# Patient Record
Sex: Male | Born: 1940 | Race: White | Hispanic: No | Smoking: Never smoker
Health system: Southern US, Community
[De-identification: ages and names within clinical notes are randomized; demographics above are authoritative.]

## PROBLEM LIST (undated history)

## (undated) DIAGNOSIS — F0390 Unspecified dementia without behavioral disturbance: Secondary | ICD-10-CM

## (undated) DIAGNOSIS — M109 Gout, unspecified: Secondary | ICD-10-CM

## (undated) DIAGNOSIS — I10 Essential (primary) hypertension: Secondary | ICD-10-CM

## (undated) DIAGNOSIS — E785 Hyperlipidemia, unspecified: Secondary | ICD-10-CM

---

## 2019-12-12 ENCOUNTER — Emergency Department (HOSPITAL_COMMUNITY): Payer: Medicare Other

## 2019-12-12 ENCOUNTER — Other Ambulatory Visit: Payer: Self-pay

## 2019-12-12 ENCOUNTER — Emergency Department (HOSPITAL_COMMUNITY)
Admission: EM | Admit: 2019-12-12 | Discharge: 2019-12-12 | Disposition: A | Discharge status: Skilled Nursing Facility | Payer: Medicare Other | Source: Home / Self Care | Attending: Emergency Medicine | Admitting: Emergency Medicine

## 2019-12-12 ENCOUNTER — Encounter (HOSPITAL_COMMUNITY): Payer: Self-pay | Admitting: Emergency Medicine

## 2019-12-12 DIAGNOSIS — N12 Tubulo-interstitial nephritis, not specified as acute or chronic: Secondary | ICD-10-CM | POA: Diagnosis not present

## 2019-12-12 DIAGNOSIS — W19XXXA Unspecified fall, initial encounter: Secondary | ICD-10-CM | POA: Insufficient documentation

## 2019-12-12 DIAGNOSIS — Y9389 Activity, other specified: Secondary | ICD-10-CM | POA: Insufficient documentation

## 2019-12-12 DIAGNOSIS — R531 Weakness: Secondary | ICD-10-CM | POA: Insufficient documentation

## 2019-12-12 DIAGNOSIS — M25561 Pain in right knee: Secondary | ICD-10-CM | POA: Insufficient documentation

## 2019-12-12 DIAGNOSIS — Y9289 Other specified places as the place of occurrence of the external cause: Secondary | ICD-10-CM | POA: Insufficient documentation

## 2019-12-12 DIAGNOSIS — R42 Dizziness and giddiness: Secondary | ICD-10-CM | POA: Insufficient documentation

## 2019-12-12 DIAGNOSIS — A4159 Other Gram-negative sepsis: Secondary | ICD-10-CM | POA: Diagnosis not present

## 2019-12-12 DIAGNOSIS — F0390 Unspecified dementia without behavioral disturbance: Secondary | ICD-10-CM | POA: Insufficient documentation

## 2019-12-12 DIAGNOSIS — R296 Repeated falls: Secondary | ICD-10-CM | POA: Insufficient documentation

## 2019-12-12 DIAGNOSIS — Y999 Unspecified external cause status: Secondary | ICD-10-CM | POA: Insufficient documentation

## 2019-12-12 LAB — URINALYSIS, ROUTINE W REFLEX MICROSCOPIC
Bilirubin Urine: NEGATIVE
Glucose, UA: NEGATIVE mg/dL
Hgb urine dipstick: NEGATIVE
Ketones, ur: 5 mg/dL — AB
Leukocytes,Ua: NEGATIVE
Nitrite: NEGATIVE
Protein, ur: NEGATIVE mg/dL
Specific Gravity, Urine: 1.014 (ref 1.005–1.030)
pH: 5 (ref 5.0–8.0)

## 2019-12-12 LAB — COMPREHENSIVE METABOLIC PANEL
ALT: 16 U/L (ref 0–44)
AST: 21 U/L (ref 15–41)
Albumin: 4 g/dL (ref 3.5–5.0)
Alkaline Phosphatase: 79 U/L (ref 38–126)
Anion gap: 10 (ref 5–15)
BUN: 18 mg/dL (ref 8–23)
CO2: 27 mmol/L (ref 22–32)
Calcium: 9.2 mg/dL (ref 8.9–10.3)
Chloride: 97 mmol/L — ABNORMAL LOW (ref 98–111)
Creatinine, Ser: 1.19 mg/dL (ref 0.61–1.24)
GFR calc Af Amer: >60 mL/min (ref >60)
GFR calc non Af Amer: 58 mL/min — ABNORMAL LOW (ref >60)
Glucose, Bld: 65 mg/dL — ABNORMAL LOW (ref 70–99)
Potassium: 4.4 mmol/L (ref 3.5–5.1)
Sodium: 134 mmol/L — ABNORMAL LOW (ref 135–145)
Total Bilirubin: 0.9 mg/dL (ref 0.3–1.2)
Total Protein: 6.6 g/dL (ref 6.5–8.1)

## 2019-12-12 LAB — CBC WITH DIFFERENTIAL/PLATELET
Abs Immature Granulocytes: 0.04 10*3/uL (ref 0.00–0.07)
Basophils Absolute: 0 10*3/uL (ref 0.0–0.1)
Basophils Relative: 0 %
Eosinophils Absolute: 0.1 10*3/uL (ref 0.0–0.5)
Eosinophils Relative: 1 %
HCT: 39.3 % (ref 39.0–52.0)
Hemoglobin: 12.8 g/dL — ABNORMAL LOW (ref 13.0–17.0)
Immature Granulocytes: 0 %
Lymphocytes Relative: 13 %
Lymphs Abs: 1.2 10*3/uL (ref 0.7–4.0)
MCH: 28.3 pg (ref 26.0–34.0)
MCHC: 32.6 g/dL (ref 30.0–36.0)
MCV: 86.8 fL (ref 80.0–100.0)
Monocytes Absolute: 0.6 10*3/uL (ref 0.1–1.0)
Monocytes Relative: 7 %
Neutro Abs: 7.1 10*3/uL (ref 1.7–7.7)
Neutrophils Relative %: 79 %
Platelets: 228 10*3/uL (ref 150–400)
RBC: 4.53 MIL/uL (ref 4.22–5.81)
RDW: 14 % (ref 11.5–15.5)
WBC: 9.1 10*3/uL (ref 4.0–10.5)
nRBC: 0 % (ref 0.0–0.2)

## 2019-12-12 MED ORDER — SODIUM CHLORIDE 0.9 % IV SOLN: INTRAVENOUS | Status: DC

## 2019-12-12 MED ORDER — SODIUM CHLORIDE 0.9 % IV BOLUS
1000.0000 mL | Freq: Once | INTRAVENOUS | Status: AC
  Administered 2019-12-12: 1000 mL via INTRAVENOUS

## 2019-12-12 NOTE — ED Provider Notes (Signed)
[facility] DEPT Provider Note   CSN: [id number] Arrival date & time: 12/12/19  1427     History Chief Complaint  Patient presents with  . Fall    Braulio Kiedrowski is a 79 y.o. male.  79 year old male who presents with increasing dizziness as well as falls.  Patient normally uses a walker to ambulate.  According to his son, patient has had increasing trouble with his gait.  Denies any recent illness.  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  Denies any recent fever, vomiting, severe diarrhea.  Did take a Xanax today with his symptoms seem to have gotten worse.  Did have a fall about a week ago according to his son who was there and did not strike his head.  He did not strike his head today and did not lose consciousness.  He denies any neck discomfort.  No urinary symptoms.  Son notes that he has been evaluated for depression and has had decreased oral intake.        No past medical history on file.  There are no problems to display for this patient.   History reviewed. No pertinent surgical history.     No family history on file.  Social History   Tobacco Use  . Smoking status: Not on file  Substance Use Topics  . Alcohol use: Not on file  . Drug use: Not on file    Home Medications Prior to Admission medications   Not on File    Allergies    Patient has no known allergies.  Review of Systems   Review of Systems  All other systems reviewed and are negative.   Physical Exam Updated Vital Signs BP 116/63   Pulse 76   Temp 98 F (36.7 C) (Oral)   Resp (!) 21   SpO2 100%   Physical Exam Vitals and nursing note reviewed.  Constitutional:      General: He is not in acute distress.    Appearance: Normal appearance. He is well-developed. He is not toxic-appearing.  HENT:     Head: Normocephalic and atraumatic.  Eyes:   General: Lids are normal.     Conjunctiva/sclera: Conjunctivae normal.     Pupils: Pupils are equal, round, and reactive to light.  Neck:     Thyroid: No thyroid mass.     Trachea: No tracheal deviation.  Cardiovascular:     Rate and Rhythm: Normal rate and regular rhythm.     Heart sounds: Normal heart sounds. No murmur. No gallop.   Pulmonary:     Effort: Pulmonary effort is normal. No respiratory distress.     Breath sounds: Normal breath sounds. No stridor. No decreased breath sounds, wheezing, rhonchi or rales.  Abdominal:     General: Bowel sounds are normal. There is no distension.     Palpations: Abdomen is soft.     Tenderness: There is no abdominal tenderness. There is no rebound.  Musculoskeletal:        General: No tenderness. Normal range of motion.     Cervical back: Normal range of motion and neck supple.       Legs:  Skin:    General: Skin is warm and dry.     Findings: No abrasion or rash.  Neurological:     General: No focal deficit present.     Mental Status: He is alert and  oriented to person, place, and time.     GCS: GCS eye subscore is 4. GCS verbal subscore is 5. GCS motor subscore is 6.     Cranial Nerves: No cranial nerve deficit.     Sensory: No sensory deficit.  Psychiatric:        Attention and Perception: He is attentive.        Mood and Affect: Affect is flat.        Speech: Speech normal.        Behavior: Behavior normal.     ED Results / Procedures / Treatments   Labs (all labs ordered are listed, but only abnormal results are displayed) Labs Reviewed  URINE CULTURE  CBC WITH DIFFERENTIAL/PLATELET  COMPREHENSIVE METABOLIC PANEL  URINALYSIS, ROUTINE W REFLEX MICROSCOPIC    EKG None  Radiology No results found.  Procedures Procedures (including critical care time)  Medications Ordered in ED Medications  sodium chloride 0.9 % bolus 1,000 mL (has no administration in time range)  0.9 %  sodium chloride infusion (has no  administration in time range)    ED Course  I have reviewed the triage vital signs and the nursing notes.  Pertinent labs & imaging results that were available during my care of the patient were reviewed by me and considered in my medical decision making (see chart for details).    MDM Rules/Calculators/A&P                      Patient's imaging of head and C-spine without acute findings.  Urinalysis negative for infection.  CBC and semen are reassuring.  Patient has neurological baseline will be discharged home. Final Clinical Impression(s) / ED Diagnoses Final diagnoses:  None    Rx / DC Orders ED Discharge Orders    None       Lorre Nick, MD 12/12/19 [phone]

## 2019-12-12 NOTE — ED Triage Notes (Signed)
Arrives via EMS from [facility], C/C fall about an hour ago. He has had 3 falls since midnight last night. Ambulates with walker, patient states he did hit his head during a fall this AM. Arrives in a c-collar. States he gets dizzy walking around or putting on clothes. Dizziness/wekaness on L side since Saturday.

## 2019-12-12 NOTE — Discharge Instructions (Addendum)
Followup with your doctor as needed

## 2019-12-14 ENCOUNTER — Emergency Department (HOSPITAL_COMMUNITY): Payer: Medicare Other

## 2019-12-14 ENCOUNTER — Inpatient Hospital Stay (HOSPITAL_COMMUNITY)
Admission: EM | Admit: 2019-12-14 | Discharge: 2019-12-22 | DRG: 871 | Disposition: A | Discharge status: Hospice | Payer: Medicare Other | Source: Skilled Nursing Facility | Attending: Family Medicine | Admitting: Family Medicine

## 2019-12-14 ENCOUNTER — Other Ambulatory Visit: Payer: Self-pay

## 2019-12-14 ENCOUNTER — Encounter (HOSPITAL_COMMUNITY): Payer: Self-pay | Admitting: Emergency Medicine

## 2019-12-14 DIAGNOSIS — N179 Acute kidney failure, unspecified: Secondary | ICD-10-CM

## 2019-12-14 DIAGNOSIS — Z7409 Other reduced mobility: Secondary | ICD-10-CM

## 2019-12-14 DIAGNOSIS — F329 Major depressive disorder, single episode, unspecified: Secondary | ICD-10-CM | POA: Diagnosis not present

## 2019-12-14 DIAGNOSIS — Z515 Encounter for palliative care: Secondary | ICD-10-CM | POA: Diagnosis not present

## 2019-12-14 DIAGNOSIS — Z789 Other specified health status: Secondary | ICD-10-CM | POA: Diagnosis not present

## 2019-12-14 DIAGNOSIS — F419 Anxiety disorder, unspecified: Secondary | ICD-10-CM | POA: Diagnosis present

## 2019-12-14 DIAGNOSIS — Z66 Do not resuscitate: Secondary | ICD-10-CM | POA: Diagnosis present

## 2019-12-14 DIAGNOSIS — E785 Hyperlipidemia, unspecified: Secondary | ICD-10-CM | POA: Diagnosis present

## 2019-12-14 DIAGNOSIS — D696 Thrombocytopenia, unspecified: Secondary | ICD-10-CM | POA: Diagnosis present

## 2019-12-14 DIAGNOSIS — A4159 Other Gram-negative sepsis: Secondary | ICD-10-CM | POA: Diagnosis present

## 2019-12-14 DIAGNOSIS — F0390 Unspecified dementia without behavioral disturbance: Secondary | ICD-10-CM

## 2019-12-14 DIAGNOSIS — Z7984 Long term (current) use of oral hypoglycemic drugs: Secondary | ICD-10-CM

## 2019-12-14 DIAGNOSIS — E876 Hypokalemia: Secondary | ICD-10-CM | POA: Diagnosis not present

## 2019-12-14 DIAGNOSIS — D72829 Elevated white blood cell count, unspecified: Secondary | ICD-10-CM | POA: Diagnosis not present

## 2019-12-14 DIAGNOSIS — Z20822 Contact with and (suspected) exposure to covid-19: Secondary | ICD-10-CM | POA: Diagnosis present

## 2019-12-14 DIAGNOSIS — N12 Tubulo-interstitial nephritis, not specified as acute or chronic: Secondary | ICD-10-CM

## 2019-12-14 DIAGNOSIS — R4189 Other symptoms and signs involving cognitive functions and awareness: Secondary | ICD-10-CM | POA: Diagnosis present

## 2019-12-14 DIAGNOSIS — Z7189 Other specified counseling: Secondary | ICD-10-CM | POA: Diagnosis not present

## 2019-12-14 DIAGNOSIS — G9341 Metabolic encephalopathy: Secondary | ICD-10-CM | POA: Diagnosis present

## 2019-12-14 DIAGNOSIS — I110 Hypertensive heart disease with heart failure: Secondary | ICD-10-CM | POA: Diagnosis present

## 2019-12-14 DIAGNOSIS — R0902 Hypoxemia: Secondary | ICD-10-CM

## 2019-12-14 DIAGNOSIS — R6521 Severe sepsis with septic shock: Secondary | ICD-10-CM | POA: Diagnosis present

## 2019-12-14 DIAGNOSIS — J9601 Acute respiratory failure with hypoxia: Secondary | ICD-10-CM | POA: Diagnosis not present

## 2019-12-14 DIAGNOSIS — Z7982 Long term (current) use of aspirin: Secondary | ICD-10-CM

## 2019-12-14 DIAGNOSIS — B964 Proteus (mirabilis) (morganii) as the cause of diseases classified elsewhere: Secondary | ICD-10-CM | POA: Diagnosis present

## 2019-12-14 DIAGNOSIS — R6520 Severe sepsis without septic shock: Secondary | ICD-10-CM | POA: Diagnosis present

## 2019-12-14 DIAGNOSIS — J810 Acute pulmonary edema: Secondary | ICD-10-CM | POA: Diagnosis present

## 2019-12-14 DIAGNOSIS — R197 Diarrhea, unspecified: Secondary | ICD-10-CM

## 2019-12-14 DIAGNOSIS — R531 Weakness: Secondary | ICD-10-CM | POA: Diagnosis not present

## 2019-12-14 DIAGNOSIS — N390 Urinary tract infection, site not specified: Secondary | ICD-10-CM | POA: Diagnosis present

## 2019-12-14 DIAGNOSIS — R296 Repeated falls: Secondary | ICD-10-CM | POA: Diagnosis present

## 2019-12-14 DIAGNOSIS — I5021 Acute systolic (congestive) heart failure: Secondary | ICD-10-CM | POA: Diagnosis present

## 2019-12-14 DIAGNOSIS — E1151 Type 2 diabetes mellitus with diabetic peripheral angiopathy without gangrene: Secondary | ICD-10-CM | POA: Diagnosis present

## 2019-12-14 DIAGNOSIS — E860 Dehydration: Secondary | ICD-10-CM | POA: Diagnosis present

## 2019-12-14 DIAGNOSIS — I10 Essential (primary) hypertension: Secondary | ICD-10-CM | POA: Diagnosis not present

## 2019-12-14 DIAGNOSIS — E869 Volume depletion, unspecified: Secondary | ICD-10-CM | POA: Diagnosis present

## 2019-12-14 DIAGNOSIS — E872 Acidosis: Secondary | ICD-10-CM | POA: Diagnosis present

## 2019-12-14 DIAGNOSIS — A419 Sepsis, unspecified organism: Secondary | ICD-10-CM

## 2019-12-14 DIAGNOSIS — I509 Heart failure, unspecified: Secondary | ICD-10-CM

## 2019-12-14 DIAGNOSIS — Z79899 Other long term (current) drug therapy: Secondary | ICD-10-CM

## 2019-12-14 DIAGNOSIS — R918 Other nonspecific abnormal finding of lung field: Secondary | ICD-10-CM | POA: Diagnosis not present

## 2019-12-14 DIAGNOSIS — R269 Unspecified abnormalities of gait and mobility: Secondary | ICD-10-CM | POA: Diagnosis not present

## 2019-12-14 DIAGNOSIS — R4781 Slurred speech: Secondary | ICD-10-CM | POA: Diagnosis present

## 2019-12-14 DIAGNOSIS — I959 Hypotension, unspecified: Secondary | ICD-10-CM | POA: Diagnosis present

## 2019-12-14 HISTORY — DX: Gout, unspecified: M10.9

## 2019-12-14 HISTORY — DX: Hyperlipidemia, unspecified: E78.5

## 2019-12-14 HISTORY — DX: Essential (primary) hypertension: I10

## 2019-12-14 HISTORY — DX: Unspecified dementia, unspecified severity, without behavioral disturbance, psychotic disturbance, mood disturbance, and anxiety: F03.90

## 2019-12-14 LAB — CBC WITH DIFFERENTIAL/PLATELET
Abs Immature Granulocytes: 0.03 10*3/uL (ref 0.00–0.07)
Basophils Absolute: 0 10*3/uL (ref 0.0–0.1)
Basophils Relative: 0 %
Eosinophils Absolute: 0 10*3/uL (ref 0.0–0.5)
Eosinophils Relative: 0 %
HCT: 42.8 % (ref 39.0–52.0)
Hemoglobin: 13.7 g/dL (ref 13.0–17.0)
Immature Granulocytes: 1 %
Lymphocytes Relative: 5 %
Lymphs Abs: 0.2 10*3/uL — ABNORMAL LOW (ref 0.7–4.0)
MCH: 28.1 pg (ref 26.0–34.0)
MCHC: 32 g/dL (ref 30.0–36.0)
MCV: 87.9 fL (ref 80.0–100.0)
Monocytes Absolute: 0 10*3/uL — ABNORMAL LOW (ref 0.1–1.0)
Monocytes Relative: 0 %
Neutro Abs: 2.9 10*3/uL (ref 1.7–7.7)
Neutrophils Relative %: 94 %
Platelets: 246 10*3/uL (ref 150–400)
RBC: 4.87 MIL/uL (ref 4.22–5.81)
RDW: 14.2 % (ref 11.5–15.5)
WBC: 3.2 10*3/uL — ABNORMAL LOW (ref 4.0–10.5)
nRBC: 0 % (ref 0.0–0.2)

## 2019-12-14 LAB — URINALYSIS, ROUTINE W REFLEX MICROSCOPIC
Bilirubin Urine: NEGATIVE
Glucose, UA: NEGATIVE mg/dL
Ketones, ur: NEGATIVE mg/dL
Nitrite: POSITIVE — AB
Protein, ur: 100 mg/dL — AB
RBC / HPF: >50 RBC/hpf — ABNORMAL HIGH (ref 0–5)
Specific Gravity, Urine: 1.013 (ref 1.005–1.030)
WBC, UA: >50 WBC/hpf — ABNORMAL HIGH (ref 0–5)
pH: 8 (ref 5.0–8.0)

## 2019-12-14 LAB — COMPREHENSIVE METABOLIC PANEL
ALT: 17 U/L (ref 0–44)
AST: 25 U/L (ref 15–41)
Albumin: 4 g/dL (ref 3.5–5.0)
Alkaline Phosphatase: 109 U/L (ref 38–126)
Anion gap: 15 (ref 5–15)
BUN: 16 mg/dL (ref 8–23)
CO2: 21 mmol/L — ABNORMAL LOW (ref 22–32)
Calcium: 9 mg/dL (ref 8.9–10.3)
Chloride: 100 mmol/L (ref 98–111)
Creatinine, Ser: 1.43 mg/dL — ABNORMAL HIGH (ref 0.61–1.24)
GFR calc Af Amer: 54 mL/min — ABNORMAL LOW (ref >60)
GFR calc non Af Amer: 47 mL/min — ABNORMAL LOW (ref >60)
Glucose, Bld: 158 mg/dL — ABNORMAL HIGH (ref 70–99)
Potassium: 4 mmol/L (ref 3.5–5.1)
Sodium: 136 mmol/L (ref 135–145)
Total Bilirubin: 1.5 mg/dL — ABNORMAL HIGH (ref 0.3–1.2)
Total Protein: 6.7 g/dL (ref 6.5–8.1)

## 2019-12-14 LAB — LACTIC ACID, PLASMA
Lactic Acid, Venous: 3.4 mmol/L (ref 0.5–1.9)
Lactic Acid, Venous: 4.8 mmol/L (ref 0.5–1.9)
Lactic Acid, Venous: 1.9 mmol/L (ref 0.5–1.9)
Lactic Acid, Venous: 2.4 mmol/L (ref 0.5–1.9)
Lactic Acid, Venous: 3.3 mmol/L (ref 0.5–1.9)

## 2019-12-14 LAB — SARS CORONAVIRUS 2 BY RT PCR (HOSPITAL ORDER, PERFORMED IN ~~LOC~~ HOSPITAL LAB): SARS Coronavirus 2: NEGATIVE

## 2019-12-14 LAB — URINE CULTURE: Culture: NO GROWTH

## 2019-12-14 LAB — APTT: aPTT: 30 seconds (ref 24–36)

## 2019-12-14 LAB — MRSA PCR SCREENING: MRSA by PCR: NEGATIVE

## 2019-12-14 LAB — PROTIME-INR
INR: 1.2 (ref 0.8–1.2)
Prothrombin Time: 14.7 seconds (ref 11.4–15.2)

## 2019-12-14 LAB — TROPONIN I (HIGH SENSITIVITY)
Troponin I (High Sensitivity): 4 ng/L (ref <18)
Troponin I (High Sensitivity): 13 ng/L (ref <18)

## 2019-12-14 LAB — LIPASE, BLOOD: Lipase: 39 U/L (ref 11–51)

## 2019-12-14 MED ORDER — VANCOMYCIN HCL 2000 MG/400ML IV SOLN
2000.0000 mg | Freq: Once | INTRAVENOUS | Status: AC
  Administered 2019-12-14: 2000 mg via INTRAVENOUS
  Filled 2019-12-14: qty 400

## 2019-12-14 MED ORDER — SODIUM CHLORIDE 0.9 % IV SOLN
2.0000 g | Freq: Two times a day (BID) | INTRAVENOUS | Status: DC
  Administered 2019-12-14: 2 g via INTRAVENOUS
  Filled 2019-12-14: qty 2

## 2019-12-14 MED ORDER — METRONIDAZOLE IN NACL 5-0.79 MG/ML-% IV SOLN
500.0000 mg | Freq: Three times a day (TID) | INTRAVENOUS | Status: DC
  Administered 2019-12-14 (×2): 500 mg via INTRAVENOUS
  Filled 2019-12-14 (×2): qty 100

## 2019-12-14 MED ORDER — SODIUM CHLORIDE 0.9 % IV BOLUS (SEPSIS)
1000.0000 mL | Freq: Once | INTRAVENOUS | Status: AC
  Administered 2019-12-14: 1000 mL via INTRAVENOUS

## 2019-12-14 MED ORDER — ACETAMINOPHEN 325 MG PO TABS
650.0000 mg | ORAL_TABLET | Freq: Once | ORAL | Status: AC
  Administered 2019-12-14: 650 mg via ORAL
  Filled 2019-12-14: qty 2

## 2019-12-14 MED ORDER — LACTATED RINGERS IV BOLUS
1000.0000 mL | Freq: Once | INTRAVENOUS | Status: AC
  Administered 2019-12-14: 1000 mL via INTRAVENOUS

## 2019-12-14 MED ORDER — ONDANSETRON HCL 4 MG/2ML IJ SOLN: 4.0000 mg | Freq: Once | INTRAMUSCULAR | Status: DC

## 2019-12-14 MED ORDER — BUPROPION HCL ER (SR) 100 MG PO TB12
100.0000 mg | ORAL_TABLET | Freq: Every day | ORAL | Status: DC
  Administered 2019-12-14 – 2019-12-22 (×7): 100 mg via ORAL
  Filled 2019-12-14 (×9): qty 1

## 2019-12-14 MED ORDER — HEPARIN SODIUM (PORCINE) 5000 UNIT/ML IJ SOLN
5000.0000 [IU] | Freq: Three times a day (TID) | INTRAMUSCULAR | Status: DC
  Administered 2019-12-14 – 2019-12-17 (×9): 5000 [IU] via SUBCUTANEOUS
  Filled 2019-12-14 (×9): qty 1

## 2019-12-14 MED ORDER — SODIUM CHLORIDE 0.9 % IV SOLN
2.0000 g | Freq: Once | INTRAVENOUS | Status: AC
  Administered 2019-12-14: 2 g via INTRAVENOUS
  Filled 2019-12-14: qty 2

## 2019-12-14 MED ORDER — ACETAMINOPHEN 650 MG RE SUPP: 650.0000 mg | RECTAL | Status: DC | PRN

## 2019-12-14 MED ORDER — PANTOPRAZOLE SODIUM 40 MG PO TBEC
40.0000 mg | DELAYED_RELEASE_TABLET | Freq: Every day | ORAL | Status: DC
  Administered 2019-12-14 – 2019-12-15 (×2): 40 mg via ORAL
  Filled 2019-12-14 (×2): qty 1

## 2019-12-14 MED ORDER — METRONIDAZOLE IN NACL 5-0.79 MG/ML-% IV SOLN
500.0000 mg | Freq: Once | INTRAVENOUS | Status: AC
  Administered 2019-12-14: 500 mg via INTRAVENOUS
  Filled 2019-12-14: qty 100

## 2019-12-14 MED ORDER — VANCOMYCIN HCL IN DEXTROSE 1-5 GM/200ML-% IV SOLN: 1000.0000 mg | INTRAVENOUS | Status: DC

## 2019-12-14 MED ORDER — CHLORHEXIDINE GLUCONATE CLOTH 2 % EX PADS
6.0000 | MEDICATED_PAD | Freq: Every day | CUTANEOUS | Status: DC
  Administered 2019-12-14 – 2019-12-21 (×7): 6 via TOPICAL

## 2019-12-14 MED ORDER — ESCITALOPRAM OXALATE 10 MG PO TABS
10.0000 mg | ORAL_TABLET | Freq: Every day | ORAL | Status: DC
  Administered 2019-12-14 – 2019-12-22 (×7): 10 mg via ORAL
  Filled 2019-12-14 (×7): qty 1

## 2019-12-14 MED ORDER — SODIUM CHLORIDE 0.9% FLUSH
3.0000 mL | Freq: Two times a day (BID) | INTRAVENOUS | Status: DC
  Administered 2019-12-14 – 2019-12-22 (×10): 3 mL via INTRAVENOUS

## 2019-12-14 MED ORDER — LORAZEPAM 2 MG/ML IJ SOLN
0.5000 mg | Freq: Once | INTRAMUSCULAR | Status: AC
  Administered 2019-12-14: 0.5 mg via INTRAVENOUS
  Filled 2019-12-14: qty 1

## 2019-12-14 MED ORDER — LACTATED RINGERS IV SOLN: INTRAVENOUS | Status: DC

## 2019-12-14 MED ORDER — ACETAMINOPHEN 325 MG PO TABS
650.0000 mg | ORAL_TABLET | ORAL | Status: DC | PRN
  Administered 2019-12-14 – 2019-12-21 (×3): 650 mg via ORAL
  Filled 2019-12-14 (×4): qty 2

## 2019-12-14 MED ORDER — VALACYCLOVIR HCL 500 MG PO TABS
500.0000 mg | ORAL_TABLET | Freq: Every day | ORAL | Status: DC
  Administered 2019-12-14 – 2019-12-22 (×7): 500 mg via ORAL
  Filled 2019-12-14 (×9): qty 1

## 2019-12-14 MED ORDER — IOHEXOL 300 MG/ML  SOLN
100.0000 mL | Freq: Once | INTRAMUSCULAR | Status: AC | PRN
  Administered 2019-12-14: 100 mL via INTRAVENOUS

## 2019-12-14 MED ORDER — SODIUM CHLORIDE (PF) 0.9 % IJ SOLN
INTRAMUSCULAR | Status: AC
  Filled 2019-12-14: qty 50

## 2019-12-14 MED ORDER — ACETAMINOPHEN 650 MG RE SUPP: 650.0000 mg | Freq: Once | RECTAL | Status: DC

## 2019-12-14 NOTE — Assessment & Plan Note (Signed)
-  Currently has some slurred speech and is very lethargic/weak to participate much in exam and history taking -Continue following mentation as fluids and antibiotics are continued

## 2019-12-14 NOTE — Assessment & Plan Note (Signed)
-  Hold BP meds in setting of sepsis and soft blood pressures

## 2019-12-14 NOTE — Progress Notes (Signed)
Notified bedside nurse of need to draw lactic acid.  

## 2019-12-14 NOTE — Hospital Course (Signed)
Clifford Dennis is a 78 yo CM with PMH HTN, HLD, dementia, gout who resides at [facility] and is brought in after a recurrent fall. He was just in the ER on 5/21 after a fall as well, also striking his head. The CT of his head revealed moderate cerebral atrophy but no acute bleed.  CT of his C-spine also was negative for acute injuries.  X-ray of right knee also negative.   Today he states that he hit his right arm but did not fall to the ground nor strike his head.  He is a difficult historian and most of HPI is obtained from chart review. On 12/12/2019 it was reported that he was having dizziness and weakness more so on the left side since Saturday of last week.  He had no fevers vomiting or diarrhea during that assessment.  There was some reported decreased p.o. intake.  Urinalysis and lab work-up were relatively unremarkable and after imaging studies, he was back to his mental baseline and he was discharged back to his ALF.  He did require a straight catheterization due to difficulty voiding however prior to being discharged back.  On arrival to the ER today he is very lethargic and falls asleep easily trying to talk.  He appears weak throughout. There is report of some vomiting and diarrhea now with vague abdominal pain.  He also is endorsing some irritation with voiding this morning.   He was febrile, 101.8, tachycardic 142, tachypneic 22, and initial BP 162/78 however this slowly down trended to 109/65 during assessment in the ER.  He also became hypoxic on room air satting down into the 80s and was placed on 2 L nasal cannula.  Notable labs include: Lactic acid 4.8 BUN 16, creatinine 1.43 (higher than baseline) WBC 3.2, 94% neutrophils UA, large Hgb, 100 protein, positive nitrites, moderate leukocyte esterase, greater than 50 WBC  In the ER he was given 3 L normal saline bolus and treated with cefepime and Flagyl for abdominal coverage with concern for GI and/or urinary source for  sepsis.

## 2019-12-14 NOTE — Assessment & Plan Note (Signed)
-   source suspected urinary vs GI. Patient had normal UA on 5/21 then required straight cath prior to discharge. Now comes with UA c/w UTI and questionable symptoms (?dysuria but patient difficult to get history from) as well as significant lethargy/weakness - follow up blood cultures in case of seeded infection - follow up urine culture and stool studies (cdiff, GI panel) - s/p cefepime/Flagyl in ER -Continue on vancomycin, cefepime, Flagyl and de-escalate as able -Initial lactic acid 4.8.  May also be confounded by some dehydration/volume depletion given GI losses.  Trend until normal.  Admit to stepdown unit given elevated lactic and downtrending blood pressures -Continue on IV fluids.  Will bolus as necessary

## 2019-12-14 NOTE — ED Notes (Signed)
Sats dropped to mid 80s while sleeping. Pt placed on 2L [place]

## 2019-12-14 NOTE — ED Triage Notes (Signed)
Patient became nauseous this morning, and when attempting to get out of bed to go to the bathroom, he fell. Patient actively dry heaving. Abrasions noted to bilateral arms and knees, no pain complaints.

## 2019-12-14 NOTE — ED Notes (Signed)
Bed alarm in place due to high fall risk

## 2019-12-14 NOTE — Progress Notes (Signed)
Pharmacy Antibiotic Note  Clifford Dennis is a 79 y.o. male admitted on 12/14/2019 with sepsis.  Pharmacy has been consulted for Vancomycin dosing.  Cefepime/Flagyl per MD.  Febrile, AKI  Plan: Vancomycin 2gm IV x1 now then 1gm IV q24h Monitor renal function and cx data   Height: 5\' 7"  (170.2 cm) Weight: 90.7 kg (200 lb) IBW/kg (Calculated) : 66.1  Temp (24hrs), Avg:101.8 F (38.8 C), Min:101.8 F (38.8 C), Max:101.8 F (38.8 C)  Recent Labs  Lab 12/12/19 1602 12/14/19 0725  WBC 9.1 3.2*  CREATININE 1.19 1.43*  LATICACIDVEN  --  4.8*    Estimated Creatinine Clearance: 45.7 mL/min (A) (by C-G formula based on SCr of 1.43 mg/dL (H)).    No Known Allergies  Antimicrobials this admission: 5/23 Cefepime >>  5/23 Flagyl >>  5/23 Vancomycin>>  Dose adjustments this admission:  Microbiology results: 5/23 BCx:  5/23 UCx:    Thank you for allowing pharmacy to be a part of this patient's care.  6/23 PharmD, BCPS 12/14/2019 12:16 PM

## 2019-12-14 NOTE — ED Notes (Signed)
Attempted to call report to [name], [place]. [name] states that he will call me back in a minute

## 2019-12-14 NOTE — Assessment & Plan Note (Signed)
-  Frequent falls at ALF -PT consult while hospitalized

## 2019-12-14 NOTE — H&P (Signed)
History and Physical    Pearla Dubonnetom Ostrom  RUE:[id number]  DOB: 04/23/1941  DOA: 12/14/2019  PCP: System, Pcp Not In Patient coming from: [facility]  Chief Complaint: fall, weakness, fever   HPI:  Mr. Clifford Dennis is a [name] with PMH HTN, HLD, dementia, gout who resides at [facility] and is brought in after a recurrent fall. He was just in the ER on 5/21 after a fall as well, also striking his head. The CT of his head revealed moderate cerebral atrophy but no acute bleed.  CT of his C-spine also was negative for acute injuries.  X-ray of right knee also negative.   Today he states that he hit his right arm but did not fall to the ground nor strike his head.  He is a difficult historian and most of HPI is obtained from chart review. On 12/12/2019 it was reported that he was having dizziness and weakness more so on the left side since Saturday of last week.  He had no fevers vomiting or diarrhea during that assessment.  There was some reported decreased p.o. intake.  Urinalysis and lab work-up were relatively unremarkable and after imaging studies, he was back to his mental baseline and he was discharged back to his ALF.  He did require a straight catheterization due to difficulty voiding however prior to being discharged back.  On arrival to the ER today he is very lethargic and falls asleep easily trying to talk.  He appears weak throughout. There is report of some vomiting and diarrhea now with vague abdominal pain.  He also is endorsing some irritation with voiding this morning.   He was febrile, 101.8, tachycardic 142, tachypneic 22, and initial BP 162/78 however this slowly down trended to 109/65 during assessment in the ER.  He also became hypoxic on room air satting down into the 80s and was placed on 2 L nasal cannula.  Notable labs include: Lactic acid 4.8 BUN 16, creatinine 1.43 (higher than baseline) WBC 3.2, 94% neutrophils UA, large Hgb, 100 protein, positive nitrites,  moderate leukocyte esterase, greater than 50 WBC  In the ER he was given 3 L normal saline bolus and treated with cefepime and Flagyl for abdominal coverage with concern for GI and/or urinary source for sepsis.   I have personally briefly [facility]  Assessment/Plan: Severe sepsis (HCC) - source suspected urinary vs GI. Patient had normal UA on 5/21 then required straight cath prior to discharge. Now comes with UA c/w UTI and questionable symptoms (?dysuria but patient difficult to get history from) as well as significant lethargy/weakness - follow up blood cultures in case of seeded infection - follow up urine culture and stool studies (cdiff, GI panel) - s/p cefepime/Flagyl in ER -Continue on vancomycin, cefepime, Flagyl and de-escalate as able -Initial lactic acid 4.8.  May also be confounded by some dehydration/volume depletion given GI losses.  Trend until normal.  Admit to stepdown unit given elevated lactic and downtrending blood pressures -Continue on IV fluids.  Will bolus as necessary  HTN (hypertension) -Hold BP meds in setting of sepsis and soft blood pressures  Dementia (HCC) -Currently has some slurred speech and is very lethargic/weak to participate much in exam and history taking -Continue following mentation as fluids and antibiotics are continued  Impaired mobility and ADLs  -Frequent falls at ALF -PT consult while hospitalized  AKI (acute kidney injury) (HCC) - likely in setting of decreased PO  intake and possibly GI losses - continue IVF and repeat BMP in am     Code Status: DNR. Discussed with patient's son, Tammy Sours via phone DVT Prophylaxis:unfractionated SQ heparin 5000 units 2 hours prior to surgery then every 12 hours Anticipated disposition is: pending PT eval and clinical improvement. ALF again vs SNF  History: Past Medical History:  Diagnosis Date  . Dementia (HCC)   . Gout   . Hyperlipidemia   .  Hypertension     History reviewed. No pertinent surgical history.   has no history on file for tobacco, alcohol, and drug.  No Known Allergies  History reviewed. No pertinent family history.  Home Medications: Prior to Admission medications   Medication Sig Start Date End Date Taking? Authorizing Provider  allopurinol (ZYLOPRIM) 300 MG tablet Take 300 mg by mouth daily.    [provider]  ALPRAZolam Prudy Feeler) 0.5 MG tablet Take 0.5 mg by mouth 3 (three) times daily as needed for anxiety.    [provider]  aspirin EC 81 MG tablet Take 81 mg by mouth daily.    [provider]  atorvastatin (LIPITOR) 20 MG tablet Take 20 mg by mouth every evening.    [provider]  buPROPion (WELLBUTRIN SR) 100 MG 12 hr tablet Take 100 mg by mouth daily.    [provider]  escitalopram (LEXAPRO) 10 MG tablet Take 10 mg by mouth daily.    [provider]  gabapentin (NEURONTIN) 300 MG capsule Take 300 mg by mouth 2 (two) times daily.    [provider]  lisinopril (ZESTRIL) 5 MG tablet Take 5 mg by mouth daily.    [provider]  metFORMIN (GLUCOPHAGE) 500 MG tablet Take 500 mg by mouth 2 (two) times daily with a meal.    [provider]  omeprazole (PRILOSEC) 20 MG capsule Take 20 mg by mouth daily.    [provider]  valACYclovir (VALTREX) 500 MG tablet Take 500 mg by mouth daily.    [provider]    Review of Systems:  Review of systems not obtained due to patient factors. Cognitive impairment   Physical Exam: Vitals:   12/14/19 1100 12/14/19 1115 12/14/19 1119 12/14/19 1200  BP: 102/68 113/63  109/65  Pulse: (!) 101 (!) 103  (!) 102  Resp: (!) 22 19  20   Temp:      TempSrc:      SpO2: 94% 95% 95% 93%  Weight:      Height:       General appearance: alert, cooperative and no distress Head: Normocephalic, without obvious abnormality Eyes: EOMI Lungs: mostly clear throughout Heart: S1,  S2 normal and Tachycardic, regular rhythm Abdomen: Nonspecific tenderness in lower abdomen with no rebound or guarding.  Bowel sounds present Extremities: No edema Skin: Sweaty, dry, intact Neurologic: Moves all 4 extremities to command.  Unable to answer orientation questions  Labs on Admission:  I have personally reviewed following labs and imaging studies Results for orders placed or performed during the hospital encounter of 12/14/19 (from the past 24 hour(s))  Lactic acid, plasma     Status: Abnormal   Collection Time: 12/14/19  7:25 AM  Result Value Ref Range   Lactic Acid, Venous 4.8 (HH) 0.5 - 1.9 mmol/L  Lactic acid, plasma     Status: Abnormal   Collection Time: 12/14/19  7:25 AM  Result Value Ref Range   Lactic Acid, Venous 3.4 (HH) 0.5 - 1.9 mmol/L  Comprehensive metabolic  panel     Status: Abnormal   Collection Time: 12/14/19  7:25 AM  Result Value Ref Range   Sodium 136 135 - 145 mmol/L   Potassium 4.0 3.5 - 5.1 mmol/L   Chloride 100 98 - 111 mmol/L   CO2 21 (L) 22 - 32 mmol/L   Glucose, Bld 158 (H) 70 - 99 mg/dL   BUN 16 8 - 23 mg/dL   Creatinine, Ser 1.43 (H) 0.61 - 1.24 mg/dL   Calcium 9.0 8.9 - 10.3 mg/dL   Total Protein 6.7 6.5 - 8.1 g/dL   Albumin 4.0 3.5 - 5.0 g/dL   AST 25 15 - 41 U/L   ALT 17 0 - 44 U/L   Alkaline Phosphatase 109 38 - 126 U/L   Total Bilirubin 1.5 (H) 0.3 - 1.2 mg/dL   GFR calc non Af Amer 47 (L) >60 mL/min   GFR calc Af Amer 54 (L) >60 mL/min   Anion gap 15 5 - 15  CBC WITH DIFFERENTIAL     Status: Abnormal   Collection Time: 12/14/19  7:25 AM  Result Value Ref Range   WBC 3.2 (L) 4.0 - 10.5 K/uL   RBC 4.87 4.22 - 5.81 MIL/uL   Hemoglobin 13.7 13.0 - 17.0 g/dL   HCT 42.8 39.0 - 52.0 %   MCV 87.9 80.0 - 100.0 fL   MCH 28.1 26.0 - 34.0 pg   MCHC 32.0 30.0 - 36.0 g/dL   RDW 14.2 11.5 - 15.5 %   Platelets 246 150 - 400 K/uL   nRBC 0.0 0.0 - 0.2 %   Neutrophils Relative % 94 %   Neutro Abs 2.9 1.7 - 7.7 K/uL   Lymphocytes  Relative 5 %   Lymphs Abs 0.2 (L) 0.7 - 4.0 K/uL   Monocytes Relative 0 %   Monocytes Absolute 0.0 (L) 0.1 - 1.0 K/uL   Eosinophils Relative 0 %   Eosinophils Absolute 0.0 0.0 - 0.5 K/uL   Basophils Relative 0 %   Basophils Absolute 0.0 0.0 - 0.1 K/uL   WBC Morphology VACUOLATED NEUTROPHILS    Immature Granulocytes 1 %   Abs Immature Granulocytes 0.03 0.00 - 0.07 K/uL   Reactive, Benign Lymphocytes PRESENT   APTT     Status: None   Collection Time: 12/14/19  7:25 AM  Result Value Ref Range   aPTT 30 24 - 36 seconds  Protime-INR     Status: None   Collection Time: 12/14/19  7:25 AM  Result Value Ref Range   Prothrombin Time 14.7 11.4 - 15.2 seconds   INR 1.2 0.8 - 1.2  Urinalysis, Routine w reflex microscopic     Status: Abnormal   Collection Time: 12/14/19  7:25 AM  Result Value Ref Range   Color, Urine YELLOW YELLOW   APPearance TURBID (A) CLEAR   Specific Gravity, Urine 1.013 1.005 - 1.030   pH 8.0 5.0 - 8.0   Glucose, UA NEGATIVE NEGATIVE mg/dL   Hgb urine dipstick LARGE (A) NEGATIVE   Bilirubin Urine NEGATIVE NEGATIVE   Ketones, ur NEGATIVE NEGATIVE mg/dL   Protein, ur 100 (A) NEGATIVE mg/dL   Nitrite POSITIVE (A) NEGATIVE   Leukocytes,Ua MODERATE (A) NEGATIVE   RBC / HPF >50 (H) 0 - 5 RBC/hpf   WBC, UA >50 (H) 0 - 5 WBC/hpf   Bacteria, UA FEW (A) NONE SEEN   WBC Clumps PRESENT    Mucus PRESENT   Lipase, blood     Status: None  Collection Time: 12/14/19  7:25 AM  Result Value Ref Range   Lipase 39 11 - 51 U/L  Troponin I (High Sensitivity)     Status: None   Collection Time: 12/14/19  7:25 AM  Result Value Ref Range   Troponin I (High Sensitivity) 4 <18 ng/L  Troponin I (High Sensitivity)     Status: None   Collection Time: 12/14/19 11:02 AM  Result Value Ref Range   Troponin I (High Sensitivity) 13 <18 ng/L     Radiological Exams on Admission: CT Head Wo Contrast  Result Date: 12/12/2019 CLINICAL DATA:  79 year old male with multiple recent falls. Head  injury. Dizziness and weakness. EXAM: CT HEAD WITHOUT CONTRAST CT CERVICAL SPINE WITHOUT CONTRAST TECHNIQUE: Multidetector CT imaging of the head and cervical spine was performed following the standard protocol without intravenous contrast. Multiplanar CT image reconstructions of the cervical spine were also generated. COMPARISON:  None. FINDINGS: CT HEAD FINDINGS Brain: Cerebral volume loss appears generalized with mild ex vacuo appearing ventricular enlargement. No evidence of transependymal edema. No midline shift, mass effect, evidence of mass lesion, intracranial hemorrhage or evidence of cortically based acute infarction. No cortical encephalomalacia identified. Mild to moderate bilateral cerebral white matter hypodensity. Vascular: Calcified atherosclerosis at the skull base. No suspicious intracranial vascular hyperdensity. Skull: Intact. Sinuses/Orbits: Visualized paranasal sinuses and mastoids are clear. Other: No acute orbit or scalp soft tissue findings. CT CERVICAL SPINE FINDINGS Alignment: Preserved lordosis. Subtle anterolisthesis at C6-C7 appears to be degenerative. Cervicothoracic junction alignment is within normal limits. Bilateral posterior element alignment is within normal limits. Skull base and vertebrae: Visualized skull base is intact. No atlanto-occipital dissociation. C1 and C2 appear intact and normally aligned. No acute osseous abnormality identified. Soft tissues and spinal canal: No prevertebral fluid or swelling. No visible canal hematoma. Right greater than left cervical carotid calcified atherosclerosis. Otherwise negative noncontrast neck soft tissues. Disc levels: Widespread cervical facet arthropathy on the left. Mild for age disc and endplate degeneration. No cervical spinal stenosis suspected. Upper chest: Visible upper thoracic levels appear intact. Negative lung apices. IMPRESSION: 1. No acute traumatic injury identified in the head or cervical spine. 2. Cerebral volume loss  and mild to moderate for age cerebral white matter changes - most commonly due to chronic small vessel disease. 3. Left side facet arthropathy but otherwise mild for age cervical spine degeneration. Electronically Signed   By: Odessa Fleming M.D.   On: 12/12/2019 17:29   CT Cervical Spine Wo Contrast  Result Date: 12/12/2019 CLINICAL DATA:  79 year old male with multiple recent falls. Head injury. Dizziness and weakness. EXAM: CT HEAD WITHOUT CONTRAST CT CERVICAL SPINE WITHOUT CONTRAST TECHNIQUE: Multidetector CT imaging of the head and cervical spine was performed following the standard protocol without intravenous contrast. Multiplanar CT image reconstructions of the cervical spine were also generated. COMPARISON:  None. FINDINGS: CT HEAD FINDINGS Brain: Cerebral volume loss appears generalized with mild ex vacuo appearing ventricular enlargement. No evidence of transependymal edema. No midline shift, mass effect, evidence of mass lesion, intracranial hemorrhage or evidence of cortically based acute infarction. No cortical encephalomalacia identified. Mild to moderate bilateral cerebral white matter hypodensity. Vascular: Calcified atherosclerosis at the skull base. No suspicious intracranial vascular hyperdensity. Skull: Intact. Sinuses/Orbits: Visualized paranasal sinuses and mastoids are clear. Other: No acute orbit or scalp soft tissue findings. CT CERVICAL SPINE FINDINGS Alignment: Preserved lordosis. Subtle anterolisthesis at C6-C7 appears to be degenerative. Cervicothoracic junction alignment is within normal limits. Bilateral posterior element alignment is within normal  limits. Skull base and vertebrae: Visualized skull base is intact. No atlanto-occipital dissociation. C1 and C2 appear intact and normally aligned. No acute osseous abnormality identified. Soft tissues and spinal canal: No prevertebral fluid or swelling. No visible canal hematoma. Right greater than left cervical carotid calcified  atherosclerosis. Otherwise negative noncontrast neck soft tissues. Disc levels: Widespread cervical facet arthropathy on the left. Mild for age disc and endplate degeneration. No cervical spinal stenosis suspected. Upper chest: Visible upper thoracic levels appear intact. Negative lung apices. IMPRESSION: 1. No acute traumatic injury identified in the head or cervical spine. 2. Cerebral volume loss and mild to moderate for age cerebral white matter changes - most commonly due to chronic small vessel disease. 3. Left side facet arthropathy but otherwise mild for age cervical spine degeneration. Electronically Signed   By: Odessa Fleming M.D.   On: 12/12/2019 17:29   CT Abdomen Pelvis W Contrast  Result Date: 12/14/2019 CLINICAL DATA:  Nausea vomiting. EXAM: CT ABDOMEN AND PELVIS WITH CONTRAST TECHNIQUE: Multidetector CT imaging of the abdomen and pelvis was performed using the standard protocol following bolus administration of intravenous contrast. CONTRAST:  OMNIPAQUE IOHEXOL 300 MG/ML  SOLN COMPARISON:  None. FINDINGS: Lower chest: Enlarged heart. Calcific atherosclerotic disease of the coronary arteries and aorta. Small hiatal hernia. Hepatobiliary: Normal appearance of the liver. The gallbladder is normally distended. No radiopaque gallstones are seen. However there is a small amount of pericholecystic fluid. The common bile duct is not dilated. Pancreas: Unremarkable. No pancreatic ductal dilatation or surrounding inflammatory changes. Spleen: Normal in size without focal abnormality. Adrenals/Urinary Tract: Normal adrenal glands. No evidence of hydronephrosis or nephrolithiasis. Bilateral perirenal fat stranding. Normal ureters and urinary bladder. Stomach/Bowel: Stomach is within normal limits. Appendix appears normal. No evidence of bowel wall distention, or inflammatory changes. Masslike thickening at the ileocecal junction may represent collapsed bowel, however true soft tissue mass cannot be excluded.  Vascular/Lymphatic: Aortic atherosclerosis. No enlarged abdominal or pelvic lymph nodes. Reproductive: Slight enlargement of the prostate gland. Other: No abdominal wall hernia or abnormality. No abdominopelvic ascites. Musculoskeletal: Spondylosis of the lumbosacral spine. IMPRESSION: 1. No radiopaque gallstones or biliary ductal dilation is seen, however there is a small amount of pericholecystic fluid, which may be seen in early acute cholecystitis. 2. Bilateral perirenal fat stranding, nonspecific. Please correlate to urinalysis. 3. Masslike thickening at the ileocecal junction may represent collapsed bowel, however true soft tissue mass cannot be excluded. Please correlate to colonoscopy when clinically feasible. 4. Slight enlargement of the prostate gland. 5. Small hiatal hernia. Aortic Atherosclerosis (ICD10-I70.0). Electronically Signed   By: Ted Mcalpine M.D.   On: 12/14/2019 10:44   DG Chest Port 1 View  Result Date: 12/14/2019 CLINICAL DATA:  Fevers EXAM: PORTABLE CHEST 1 VIEW COMPARISON:  None. FINDINGS: Cardiac shadow is within normal limits. Aortic calcifications are seen. The lungs are clear. No bony abnormality is noted. IMPRESSION: No acute abnormality seen. Electronically Signed   By: Alcide Clever M.D.   On: 12/14/2019 08:10   DG Knee Complete 4 Views Right  Result Date: 12/12/2019 CLINICAL DATA:  Diffuse right knee pain following a fall. EXAM: RIGHT KNEE - COMPLETE 4+ VIEW COMPARISON:  None. FINDINGS: Mild to moderate medial joint space narrowing with associated minimal spur formation. Minimal lateral and patellofemoral spur formation. No fracture, dislocation or effusion. IMPRESSION: No fracture. Minimal degenerative changes. Electronically Signed   By: Beckie Salts M.D.   On: 12/12/2019 16:00   CT Abdomen Pelvis W Contrast  Final Result    DG Chest [facility]  Final Result      Consults called:  n/a   EKG: Independently reviewed. Sinus tach.  Less than 1 mm ST  depression in V5, V6   Lewie Chamber, MD [facility] Pager: Secure chat via [place] pager # : [phone]  If 7PM-7AM, please contact night-coverage [email or web address] Use universal [place] password for that web site. If you do not have the password, please call the hospital operator.  12/14/2019, 1:02 PM

## 2019-12-14 NOTE — ED Notes (Signed)
Pts son is at bedside  

## 2019-12-14 NOTE — ED Notes (Addendum)
Clifford Dennis, phone #[phone]

## 2019-12-14 NOTE — Assessment & Plan Note (Signed)
-   likely in setting of decreased PO intake and possibly GI losses - continue IVF and repeat BMP in am

## 2019-12-14 NOTE — Progress Notes (Signed)
A consult was received from an ED physician for cefepime per pharmacy dosing (for an indication other than meningitis). The patient's profile has been reviewed for ht/wt/allergies/indication/available labs. A one time order has been placed for the above antibiotics.  Further antibiotics/pharmacy consults should be ordered by admitting physician if indicated.                       Bernadene Person, PharmD, BCPS [phone] 12/14/2019, 10:10 AM

## 2019-12-14 NOTE — ED Notes (Signed)
BP was 70/40 taken manually. MD paged.

## 2019-12-14 NOTE — ED Provider Notes (Addendum)
[facility]-EMERGENCY DEPT Provider Note   CSN: [id number] Arrival date & time: 12/14/19  8841     History Chief Complaint  Patient presents with  . Fall  . Nausea    Clifford Dennis is a 79 y.o. male. Level 5 caveat secondary to dementia and acuity of condition.  Patient presents by EMS from facility after acute onset of nausea vomiting this morning.  Patient fell trying to get out of bed to the bathroom.  Dry heaves.  Acknowledges does have pain but cannot tell me where.  The history is provided by the patient and the EMS personnel.  Emesis Severity:  Moderate Timing:  Intermittent Quality:  Unable to specify Progression:  Unchanged Chronicity:  New Relieved by:  None tried Worsened by:  Nothing Ineffective treatments:  None tried Associated symptoms: abdominal pain, chills, cough, diarrhea and fever   Associated symptoms: no headaches and no sore throat        Past Medical History:  Diagnosis Date  . Dementia (HCC)   . Gout   . Hyperlipidemia   . Hypertension     There are no problems to display for this patient.   No past surgical history on file.     No family history on file.  Social History   Tobacco Use  . Smoking status: Not on file  Substance Use Topics  . Alcohol use: Not on file  . Drug use: Not on file    Home Medications Prior to Admission medications   Medication Sig Start Date End Date Taking? Authorizing Provider  allopurinol (ZYLOPRIM) 300 MG tablet Take 300 mg by mouth daily.    [provider]  ALPRAZolam Prudy Feeler) 0.5 MG tablet Take 0.5 mg by mouth 3 (three) times daily as needed for anxiety.    [provider]  aspirin EC 81 MG tablet Take 81 mg by mouth daily.    [provider]  atorvastatin (LIPITOR) 20 MG tablet Take 20 mg by mouth every evening.    [provider]  buPROPion (WELLBUTRIN SR) 100 MG 12 hr tablet Take 100 mg by mouth daily.    [provider]    escitalopram (LEXAPRO) 10 MG tablet Take 10 mg by mouth daily.    [provider]  gabapentin (NEURONTIN) 300 MG capsule Take 300 mg by mouth 2 (two) times daily.    [provider]  lisinopril (ZESTRIL) 5 MG tablet Take 5 mg by mouth daily.    [provider]  metFORMIN (GLUCOPHAGE) 500 MG tablet Take 500 mg by mouth 2 (two) times daily with a meal.    [provider]  omeprazole (PRILOSEC) 20 MG capsule Take 20 mg by mouth daily.    [provider]  valACYclovir (VALTREX) 500 MG tablet Take 500 mg by mouth daily.    [provider]    Allergies    Patient has no known allergies.  Review of Systems   Review of Systems  Constitutional: Positive for chills and fever.  HENT: Negative for sore throat.   Eyes: Negative for visual disturbance.  Respiratory: Positive for cough.   Cardiovascular: Negative for chest pain.  Gastrointestinal: Positive for abdominal pain, diarrhea, nausea and vomiting.  Genitourinary: Negative for hematuria.  Musculoskeletal: Negative for neck pain.  Skin: Positive for wound.  Neurological: Negative for headaches.    Physical Exam Updated Vital Signs BP (!) 80/47   Pulse 93   Temp 97.7 F (36.5 C) (Oral)   Resp Marland Kitchen)  22   Ht 5\' 7"  (1.702 m)   Wt 90.7 kg   SpO2 96%   BMI 31.32 kg/m   Physical Exam Vitals and nursing note reviewed.  Constitutional:      Appearance: He is well-developed.  HENT:     Head: Normocephalic and atraumatic.  Eyes:     Conjunctiva/sclera: Conjunctivae normal.  Cardiovascular:     Rate and Rhythm: Regular rhythm. Tachycardia present.     Heart sounds: No murmur.  Pulmonary:     Effort: Pulmonary effort is normal. No respiratory distress.     Breath sounds: Normal breath sounds.  Abdominal:     Palpations: Abdomen is soft.     Tenderness: There is no abdominal tenderness. There is no guarding or rebound.  Musculoskeletal:        General: Deformity and signs of  injury present.     Cervical back: Neck supple.     Comments: Abrasions to both elbows.  Full range of motion of upper and lower extremities without any pain or limitations.  Skin:    General: Skin is warm and dry.     Capillary Refill: Capillary refill takes less than 2 seconds.  Neurological:     General: No focal deficit present.     Mental Status: He is alert. Mental status is at baseline.     ED Results / Procedures / Treatments   Labs (all labs ordered are listed, but only abnormal results are displayed) Labs Reviewed  LACTIC ACID, PLASMA - Abnormal; Notable for the following components:      Result Value   Lactic Acid, Venous 4.8 (*)    All other components within normal limits  LACTIC ACID, PLASMA - Abnormal; Notable for the following components:   Lactic Acid, Venous 3.4 (*)    All other components within normal limits  COMPREHENSIVE METABOLIC PANEL - Abnormal; Notable for the following components:   CO2 21 (*)    Glucose, Bld 158 (*)    Creatinine, Ser 1.43 (*)    Total Bilirubin 1.5 (*)    GFR calc non Af Amer 47 (*)    GFR calc Af Amer 54 (*)    All other components within normal limits  CBC WITH DIFFERENTIAL/PLATELET - Abnormal; Notable for the following components:   WBC 3.2 (*)    Lymphs Abs 0.2 (*)    Monocytes Absolute 0.0 (*)    All other components within normal limits  URINALYSIS, ROUTINE W REFLEX MICROSCOPIC - Abnormal; Notable for the following components:   APPearance TURBID (*)    Hgb urine dipstick LARGE (*)    Protein, ur 100 (*)    Nitrite POSITIVE (*)    Leukocytes,Ua MODERATE (*)    RBC / HPF >50 (*)    WBC, UA >50 (*)    Bacteria, UA FEW (*)    All other components within normal limits  SARS CORONAVIRUS 2 BY RT PCR (HOSPITAL ORDER, PERFORMED IN [facility])  CULTURE, BLOOD (ROUTINE X 2)  CULTURE, BLOOD (ROUTINE X 2)  URINE CULTURE  GASTROINTESTINAL PANEL BY PCR, STOOL (REPLACES STOOL CULTURE)  C DIFFICILE QUICK SCREEN W PCR  REFLEX  APTT  PROTIME-INR  LIPASE, BLOOD  LACTIC ACID, PLASMA  LACTIC ACID, PLASMA  PROTIME-INR  CORTISOL-AM, BLOOD  PROCALCITONIN  BASIC METABOLIC PANEL  CBC WITH DIFFERENTIAL/PLATELET  MAGNESIUM  TROPONIN I (HIGH SENSITIVITY)  TROPONIN I (HIGH SENSITIVITY)    EKG EKG Interpretation  Date/Time:  Sunday Dec 14 2019 07:04:04  EDT Ventricular Rate:  125 PR Interval:    QRS Duration: 93 QT Interval:  377 QTC Calculation: 544 R Axis:   110 Text Interpretation: Sinus tachycardia Left posterior fascicular block Borderline repolarization abnormality Prolonged QT interval new ischemic changes and QTC prolongation Confirmed by Meridee ScoreButler, Jaythan Hinely [phone]([id number]) on 12/14/2019 7:09:25 AM   Radiology CT Head Wo Contrast  Result Date: 12/12/2019 CLINICAL DATA:  79 year old male with multiple recent falls. Head injury. Dizziness and weakness. EXAM: CT HEAD WITHOUT CONTRAST CT CERVICAL SPINE WITHOUT CONTRAST TECHNIQUE: Multidetector CT imaging of the head and cervical spine was performed following the standard protocol without intravenous contrast. Multiplanar CT image reconstructions of the cervical spine were also generated. COMPARISON:  None. FINDINGS: CT HEAD FINDINGS Brain: Cerebral volume loss appears generalized with mild ex vacuo appearing ventricular enlargement. No evidence of transependymal edema. No midline shift, mass effect, evidence of mass lesion, intracranial hemorrhage or evidence of cortically based acute infarction. No cortical encephalomalacia identified. Mild to moderate bilateral cerebral white matter hypodensity. Vascular: Calcified atherosclerosis at the skull base. No suspicious intracranial vascular hyperdensity. Skull: Intact. Sinuses/Orbits: Visualized paranasal sinuses and mastoids are clear. Other: No acute orbit or scalp soft tissue findings. CT CERVICAL SPINE FINDINGS Alignment: Preserved lordosis. Subtle anterolisthesis at C6-C7 appears to be degenerative. Cervicothoracic  junction alignment is within normal limits. Bilateral posterior element alignment is within normal limits. Skull base and vertebrae: Visualized skull base is intact. No atlanto-occipital dissociation. C1 and C2 appear intact and normally aligned. No acute osseous abnormality identified. Soft tissues and spinal canal: No prevertebral fluid or swelling. No visible canal hematoma. Right greater than left cervical carotid calcified atherosclerosis. Otherwise negative noncontrast neck soft tissues. Disc levels: Widespread cervical facet arthropathy on the left. Mild for age disc and endplate degeneration. No cervical spinal stenosis suspected. Upper chest: Visible upper thoracic levels appear intact. Negative lung apices. IMPRESSION: 1. No acute traumatic injury identified in the head or cervical spine. 2. Cerebral volume loss and mild to moderate for age cerebral white matter changes - most commonly due to chronic small vessel disease. 3. Left side facet arthropathy but otherwise mild for age cervical spine degeneration. Electronically Signed   By: Odessa FlemingH  Hall M.D.   On: 12/12/2019 17:29   CT Cervical Spine Wo Contrast  Result Date: 12/12/2019 CLINICAL DATA:  79 year old male with multiple recent falls. Head injury. Dizziness and weakness. EXAM: CT HEAD WITHOUT CONTRAST CT CERVICAL SPINE WITHOUT CONTRAST TECHNIQUE: Multidetector CT imaging of the head and cervical spine was performed following the standard protocol without intravenous contrast. Multiplanar CT image reconstructions of the cervical spine were also generated. COMPARISON:  None. FINDINGS: CT HEAD FINDINGS Brain: Cerebral volume loss appears generalized with mild ex vacuo appearing ventricular enlargement. No evidence of transependymal edema. No midline shift, mass effect, evidence of mass lesion, intracranial hemorrhage or evidence of cortically based acute infarction. No cortical encephalomalacia identified. Mild to moderate bilateral cerebral white matter  hypodensity. Vascular: Calcified atherosclerosis at the skull base. No suspicious intracranial vascular hyperdensity. Skull: Intact. Sinuses/Orbits: Visualized paranasal sinuses and mastoids are clear. Other: No acute orbit or scalp soft tissue findings. CT CERVICAL SPINE FINDINGS Alignment: Preserved lordosis. Subtle anterolisthesis at C6-C7 appears to be degenerative. Cervicothoracic junction alignment is within normal limits. Bilateral posterior element alignment is within normal limits. Skull base and vertebrae: Visualized skull base is intact. No atlanto-occipital dissociation. C1 and C2 appear intact and normally aligned. No acute osseous abnormality identified. Soft tissues and spinal canal: No prevertebral fluid or  swelling. No visible canal hematoma. Right greater than left cervical carotid calcified atherosclerosis. Otherwise negative noncontrast neck soft tissues. Disc levels: Widespread cervical facet arthropathy on the left. Mild for age disc and endplate degeneration. No cervical spinal stenosis suspected. Upper chest: Visible upper thoracic levels appear intact. Negative lung apices. IMPRESSION: 1. No acute traumatic injury identified in the head or cervical spine. 2. Cerebral volume loss and mild to moderate for age cerebral white matter changes - most commonly due to chronic small vessel disease. 3. Left side facet arthropathy but otherwise mild for age cervical spine degeneration. Electronically Signed   By: Genevie Ann M.D.   On: 12/12/2019 17:29   CT Abdomen Pelvis W Contrast  Result Date: 12/14/2019 CLINICAL DATA:  Nausea vomiting. EXAM: CT ABDOMEN AND PELVIS WITH CONTRAST TECHNIQUE: Multidetector CT imaging of the abdomen and pelvis was performed using the standard protocol following bolus administration of intravenous contrast. CONTRAST:  179mL OMNIPAQUE IOHEXOL 300 MG/ML  SOLN COMPARISON:  None. FINDINGS: Lower chest: Enlarged heart. Calcific atherosclerotic disease of the coronary arteries  and aorta. Small hiatal hernia. Hepatobiliary: Normal appearance of the liver. The gallbladder is normally distended. No radiopaque gallstones are seen. However there is a small amount of pericholecystic fluid. The common bile duct is not dilated. Pancreas: Unremarkable. No pancreatic ductal dilatation or surrounding inflammatory changes. Spleen: Normal in size without focal abnormality. Adrenals/Urinary Tract: Normal adrenal glands. No evidence of hydronephrosis or nephrolithiasis. Bilateral perirenal fat stranding. Normal ureters and urinary bladder. Stomach/Bowel: Stomach is within normal limits. Appendix appears normal. No evidence of bowel wall distention, or inflammatory changes. Masslike thickening at the ileocecal junction may represent collapsed bowel, however true soft tissue mass cannot be excluded. Vascular/Lymphatic: Aortic atherosclerosis. No enlarged abdominal or pelvic lymph nodes. Reproductive: Slight enlargement of the prostate gland. Other: No abdominal wall hernia or abnormality. No abdominopelvic ascites. Musculoskeletal: Spondylosis of the lumbosacral spine. IMPRESSION: 1. No radiopaque gallstones or biliary ductal dilation is seen, however there is a small amount of pericholecystic fluid, which may be seen in early acute cholecystitis. 2. Bilateral perirenal fat stranding, nonspecific. Please correlate to urinalysis. 3. Masslike thickening at the ileocecal junction may represent collapsed bowel, however true soft tissue mass cannot be excluded. Please correlate to colonoscopy when clinically feasible. 4. Slight enlargement of the prostate gland. 5. Small hiatal hernia. Aortic Atherosclerosis (ICD10-I70.0). Electronically Signed   By: Fidela Salisbury M.D.   On: 12/14/2019 10:44   DG Chest Port 1 View  Result Date: 12/14/2019 CLINICAL DATA:  Fevers EXAM: PORTABLE CHEST 1 VIEW COMPARISON:  None. FINDINGS: Cardiac shadow is within normal limits. Aortic calcifications are seen. The lungs  are clear. No bony abnormality is noted. IMPRESSION: No acute abnormality seen. Electronically Signed   By: Inez Catalina M.D.   On: 12/14/2019 08:10    Procedures .Critical Care Performed by: Hayden Rasmussen, MD Authorized by: Hayden Rasmussen, MD   Critical care provider statement:    Critical care time (minutes):  45   Critical care time was exclusive of:  Separately billable procedures and treating other patients   Critical care was necessary to treat or prevent imminent or life-threatening deterioration of the following conditions:  Sepsis   Critical care was time spent personally by me on the following activities:  Discussions with consultants, evaluation of patient's response to treatment, examination of patient, ordering and performing treatments and interventions, ordering and review of laboratory studies, ordering and review of radiographic studies, pulse oximetry, re-evaluation of patient's  condition, obtaining history from patient or surrogate, review of old charts and development of treatment plan with patient or surrogate   I assumed direction of critical care for this patient from another provider in my specialty: no     (including critical care time)  Medications Ordered in ED Medications  sodium chloride (PF) 0.9 % injection (has no administration in time range)  ceFEPIme (MAXIPIME) 2 g in sodium chloride 0.9 % 100 mL IVPB (has no administration in time range)  metroNIDAZOLE (FLAGYL) IVPB 500 mg (500 mg Intravenous New Bag/Given 12/14/19 1542)  lactated ringers infusion ( Intravenous Stopped 12/14/19 1530)  buPROPion (WELLBUTRIN SR) 12 hr tablet 100 mg (100 mg Oral Given 12/14/19 1540)  escitalopram (LEXAPRO) tablet 10 mg (10 mg Oral Given 12/14/19 1540)  pantoprazole (PROTONIX) EC tablet 40 mg (40 mg Oral Given 12/14/19 1537)  valACYclovir (VALTREX) tablet 500 mg (500 mg Oral Given 12/14/19 1537)  heparin injection 5,000 Units (5,000 Units Subcutaneous Given 12/14/19 1538)    sodium chloride flush (NS) 0.9 % injection 3 mL (3 mLs Intravenous Given 12/14/19 1541)  acetaminophen (TYLENOL) tablet 650 mg (has no administration in time range)    Or  acetaminophen (TYLENOL) suppository 650 mg (has no administration in time range)  vancomycin (VANCOCIN) IVPB 1000 mg/200 mL premix (has no administration in time range)  sodium chloride 0.9 % bolus 1,000 mL (0 mLs Intravenous Stopped 12/14/19 0901)    And  sodium chloride 0.9 % bolus 1,000 mL (0 mLs Intravenous Stopped 12/14/19 1117)    And  sodium chloride 0.9 % bolus 1,000 mL (0 mLs Intravenous Stopped 12/14/19 1117)  LORazepam (ATIVAN) injection 0.5 mg (0.5 mg Intravenous Given 12/14/19 0800)  acetaminophen (TYLENOL) tablet 650 mg (650 mg Oral Given 12/14/19 0814)  ceFEPIme (MAXIPIME) 2 g in sodium chloride 0.9 % 100 mL IVPB (0 g Intravenous Stopped 12/14/19 1117)  metroNIDAZOLE (FLAGYL) IVPB 500 mg (0 mg Intravenous Stopped 12/14/19 1117)  iohexol (OMNIPAQUE) 300 MG/ML solution 100 mL (100 mLs Intravenous Contrast Given 12/14/19 0941)  vancomycin (VANCOREADY) IVPB 2000 mg/400 mL (2,000 mg Intravenous New Bag/Given 12/14/19 1324)  lactated ringers bolus 1,000 mL (1,000 mLs Intravenous New Bag/Given 12/14/19 1629)    ED Course  I have reviewed the triage vital signs and the nursing notes.  Pertinent labs & imaging results that were available during my care of the patient were reviewed by me and considered in my medical decision making (see chart for details).  Clinical Course as of Dec 13 1704  Sun Dec 14, 2019  5027 Chest x-ray interpreted by me as no gross infiltrates.  EKG interpreted by me as new ischemic changes and QTC prolongation.  Have ordered Ativan for his nausea has trying to avoid QTC prolonging medications.   [MB]  1105 Patient's son is here now.  He relates that the patient was here few days ago for evaluation of a fall.  He said at that time they catheterized him for a urine.  I updated him on the results of  the current labs.  He understands he will need to be admitted for further work-up.  He is the power of attorney and he said that the patient is a DNR/DNI.   [MB]  1120 Discussed with [facility] hospitalist Dr. Frederick Peers   [MB]    Clinical Course User Index [MB] Terrilee Files, MD   MDM Rules/Calculators/A&P  This patient complains of nausea and diarrhea; this involves an extensive number of treatment Options and is a complaint that carries with it a high risk of complications and Morbidity. The differential includes sepsis, metabolic derangement, infection, trauma.  Also has prolonged QTC which makes medication management more critical.  I ordered, reviewed and interpreted labs, which included CBC which shows a low white count of 3.2, normal hemoglobin and platelets, chemistries with an elevated creatinine and lower bicarb consistent with some dehydration.  Urine grossly abnormal with nitrite positive greater than 50 whites greater than 50 reds.  Lactate critically elevated and will need to be trended. I ordered medication cefepime Vanco IV fluids Ativan for nausea vomiting I ordered imaging studies which included chest x-ray and CT abdomen and pelvis and I independently    visualized and interpreted imaging which showed no acute infiltrate in the chest x-ray.  CT abdomen and pelvis does show some perinephric stranding along with some fluid around the gallbladder.  Radiology also commented upon some bowel thickening consider soft tissue mass Additional history obtained from patient's son Previous records obtained and reviewed in epic including last ED visit few days ago I consulted [facility] hospitalist Dr. Ricki Rodriguez and discussed lab and imaging findings  Critical Interventions: Recognition of sepsis and appropriate intervention with antibiotics and fluids  After the interventions stated above, I reevaluated the patient and found to be hemodynamically improved.  Blood pressure  remains good and tachycardia resolved.  Will need admission for continued management. Jacobb Alen was evaluated in Emergency Department on 12/14/2019 for the symptoms described in the history of present illness. He was evaluated in the context of the global COVID-19 pandemic, which necessitated consideration that the patient might be at risk for infection with the SARS-CoV-2 virus that causes COVID-19. Institutional protocols and algorithms that pertain to the evaluation of patients at risk for COVID-19 are in a state of rapid change based on information released by regulatory bodies including the CDC and federal and state organizations. These policies and algorithms were followed during the patient's care in the ED.   Final Clinical Impression(s) / ED Diagnoses Final diagnoses:  Sepsis, due to unspecified organism, unspecified whether acute organ dysfunction present [facility])  Pyelonephritis  Diarrhea, unspecified type    Rx / DC Orders ED Discharge Orders    None       Terrilee Files, MD 12/14/19 1710    Terrilee Files, MD 12/22/19 1240

## 2019-12-15 ENCOUNTER — Encounter (HOSPITAL_COMMUNITY): Payer: Self-pay | Admitting: Internal Medicine

## 2019-12-15 ENCOUNTER — Inpatient Hospital Stay (HOSPITAL_COMMUNITY): Payer: Medicare Other

## 2019-12-15 LAB — BLOOD CULTURE ID PANEL (REFLEXED)
Acinetobacter baumannii: NOT DETECTED
Candida albicans: NOT DETECTED
Candida glabrata: NOT DETECTED
Candida krusei: NOT DETECTED
Candida parapsilosis: NOT DETECTED
Candida tropicalis: NOT DETECTED
Carbapenem resistance: NOT DETECTED
Enterobacter cloacae complex: NOT DETECTED
Enterobacteriaceae species: DETECTED — AB
Enterococcus species: NOT DETECTED
Escherichia coli: NOT DETECTED
Haemophilus influenzae: NOT DETECTED
Klebsiella oxytoca: NOT DETECTED
Klebsiella pneumoniae: NOT DETECTED
Listeria monocytogenes: NOT DETECTED
Methicillin resistance: NOT DETECTED
Neisseria meningitidis: NOT DETECTED
Proteus species: DETECTED — AB
Pseudomonas aeruginosa: NOT DETECTED
Serratia marcescens: NOT DETECTED
Staphylococcus aureus (BCID): NOT DETECTED
Staphylococcus species: DETECTED — AB
Streptococcus agalactiae: NOT DETECTED
Streptococcus pneumoniae: NOT DETECTED
Streptococcus pyogenes: NOT DETECTED
Streptococcus species: NOT DETECTED

## 2019-12-15 LAB — CBC WITH DIFFERENTIAL/PLATELET
Abs Immature Granulocytes: 1.16 10*3/uL — ABNORMAL HIGH (ref 0.00–0.07)
Basophils Absolute: 0.1 10*3/uL (ref 0.0–0.1)
Basophils Relative: 0 %
Eosinophils Absolute: 0 10*3/uL (ref 0.0–0.5)
Eosinophils Relative: 0 %
HCT: 36.4 % — ABNORMAL LOW (ref 39.0–52.0)
Hemoglobin: 11.8 g/dL — ABNORMAL LOW (ref 13.0–17.0)
Immature Granulocytes: 6 %
Lymphocytes Relative: 1 %
Lymphs Abs: 0.1 10*3/uL — ABNORMAL LOW (ref 0.7–4.0)
MCH: 28.8 pg (ref 26.0–34.0)
MCHC: 32.4 g/dL (ref 30.0–36.0)
MCV: 88.8 fL (ref 80.0–100.0)
Monocytes Absolute: 0.3 10*3/uL (ref 0.1–1.0)
Monocytes Relative: 1 %
Neutro Abs: 17.1 10*3/uL — ABNORMAL HIGH (ref 1.7–7.7)
Neutrophils Relative %: 92 %
Platelets: 144 10*3/uL — ABNORMAL LOW (ref 150–400)
RBC: 4.1 MIL/uL — ABNORMAL LOW (ref 4.22–5.81)
RDW: 14.5 % (ref 11.5–15.5)
WBC: 18.6 10*3/uL — ABNORMAL HIGH (ref 4.0–10.5)
nRBC: 0 % (ref 0.0–0.2)

## 2019-12-15 LAB — MAGNESIUM: Magnesium: 0.7 mg/dL — CL (ref 1.7–2.4)

## 2019-12-15 LAB — BASIC METABOLIC PANEL
Anion gap: 11 (ref 5–15)
BUN: 20 mg/dL (ref 8–23)
CO2: 19 mmol/L — ABNORMAL LOW (ref 22–32)
Calcium: 7.5 mg/dL — ABNORMAL LOW (ref 8.9–10.3)
Chloride: 105 mmol/L (ref 98–111)
Creatinine, Ser: 1.48 mg/dL — ABNORMAL HIGH (ref 0.61–1.24)
GFR calc Af Amer: 52 mL/min — ABNORMAL LOW (ref >60)
GFR calc non Af Amer: 45 mL/min — ABNORMAL LOW (ref >60)
Glucose, Bld: 91 mg/dL (ref 70–99)
Potassium: 3.9 mmol/L (ref 3.5–5.1)
Sodium: 135 mmol/L (ref 135–145)

## 2019-12-15 LAB — GLUCOSE, CAPILLARY
Glucose-Capillary: 80 mg/dL (ref 70–99)
Glucose-Capillary: 78 mg/dL (ref 70–99)

## 2019-12-15 LAB — HEMOGLOBIN A1C
Hgb A1c MFr Bld: 6.6 % — ABNORMAL HIGH (ref 4.8–5.6)
Mean Plasma Glucose: 142.72 mg/dL

## 2019-12-15 LAB — LACTIC ACID, PLASMA
Lactic Acid, Venous: 3.3 mmol/L (ref 0.5–1.9)
Lactic Acid, Venous: 2.7 mmol/L (ref 0.5–1.9)

## 2019-12-15 LAB — PROTIME-INR
INR: 1.4 — ABNORMAL HIGH (ref 0.8–1.2)
Prothrombin Time: 16.9 seconds — ABNORMAL HIGH (ref 11.4–15.2)

## 2019-12-15 LAB — PROCALCITONIN: Procalcitonin: 97.64 ng/mL

## 2019-12-15 LAB — CORTISOL-AM, BLOOD: Cortisol - AM: 40.2 ug/dL — ABNORMAL HIGH (ref 6.7–22.6)

## 2019-12-15 MED ORDER — ATORVASTATIN CALCIUM 20 MG PO TABS
20.0000 mg | ORAL_TABLET | Freq: Every evening | ORAL | Status: DC
  Administered 2019-12-15 – 2019-12-21 (×4): 20 mg via ORAL
  Filled 2019-12-15 (×2): qty 1
  Filled 2019-12-15: qty 2
  Filled 2019-12-15: qty 1

## 2019-12-15 MED ORDER — ASPIRIN EC 81 MG PO TBEC
81.0000 mg | DELAYED_RELEASE_TABLET | Freq: Every day | ORAL | Status: DC
  Administered 2019-12-15: 81 mg via ORAL
  Filled 2019-12-15: qty 1

## 2019-12-15 MED ORDER — LORAZEPAM 2 MG/ML IJ SOLN
0.2500 mg | Freq: Once | INTRAMUSCULAR | Status: AC
  Administered 2019-12-15: 0.25 mg via INTRAVENOUS
  Filled 2019-12-15: qty 1

## 2019-12-15 MED ORDER — SODIUM CHLORIDE 0.9 % IV SOLN
2.0000 g | INTRAVENOUS | Status: DC
  Administered 2019-12-15 – 2019-12-16 (×2): 2 g via INTRAVENOUS
  Filled 2019-12-15 (×2): qty 20

## 2019-12-15 MED ORDER — MAGNESIUM SULFATE 4 GM/100ML IV SOLN
4.0000 g | Freq: Once | INTRAVENOUS | Status: AC
  Administered 2019-12-15: 4 g via INTRAVENOUS
  Filled 2019-12-15: qty 100

## 2019-12-15 MED ORDER — INSULIN ASPART 100 UNIT/ML ~~LOC~~ SOLN
0.0000 [IU] | Freq: Four times a day (QID) | SUBCUTANEOUS | Status: DC
  Administered 2019-12-16: 1 [IU] via SUBCUTANEOUS
  Administered 2019-12-20: 0 [IU] via SUBCUTANEOUS
  Administered 2019-12-21 (×2): 1 [IU] via SUBCUTANEOUS
  Administered 2019-12-21 – 2019-12-22 (×2): 2 [IU] via SUBCUTANEOUS

## 2019-12-15 MED ORDER — DIPHENHYDRAMINE HCL 50 MG/ML IJ SOLN
12.5000 mg | Freq: Once | INTRAMUSCULAR | Status: DC
  Filled 2019-12-15: qty 1

## 2019-12-15 MED ORDER — LORAZEPAM 2 MG/ML IJ SOLN
0.5000 mg | Freq: Once | INTRAMUSCULAR | Status: AC
  Administered 2019-12-15: 0.5 mg via INTRAVENOUS
  Filled 2019-12-15: qty 1

## 2019-12-15 MED ORDER — ALLOPURINOL 300 MG PO TABS
300.0000 mg | ORAL_TABLET | Freq: Every day | ORAL | Status: DC
  Administered 2019-12-15 – 2019-12-22 (×6): 300 mg via ORAL
  Filled 2019-12-15 (×3): qty 1
  Filled 2019-12-15: qty 3
  Filled 2019-12-15 (×2): qty 1

## 2019-12-15 NOTE — Progress Notes (Signed)
PT Cancellation Note  Patient Details Name: Clifford Dennis MRN: [id number] DOB: 20-Jul-1941   Cancelled Treatment:    Reason Eval/Treat Not Completed: Fatigue/lethargy limiting ability to participate;Patient's level of consciousness, currently quiet and resting, has Bil soft wrist supports. Will attempt eval tomorrow.   Rada Hay 12/15/2019, 3:00 PM  Blanchard Kelch PT Acute Rehabilitation Services Pager [phone] Office [phone]

## 2019-12-15 NOTE — Evaluation (Signed)
SLP Cancellation Note  Patient Details Name: Clifford Dennis MRN: [id number] DOB: [birth year]   Cancelled treatment:       Reason Eval/Treat Not Completed: Medical issues which prohibited therapy(spoke to RN Debra who advised she was informed pt needed to be npo for now, note results of CT abdomen, requested RN secure chat SLP if pt able to have po. Thanks)  Clifford Infante, MS [facility] SLP Acute Rehab Services Office [phone]  Clifford Dennis 12/15/2019, 10:54 AM

## 2019-12-15 NOTE — TOC Initial Note (Signed)
Transition of Care [facility]) - Initial/Assessment Note    Patient Details  Name: Clifford Dennis MRN: [id number] Date of Birth: 1941/05/22  [facility]) CM/SW Contact:    Golda Acre, RN Phone Number: 12/15/2019, 8:35 AM  Clinical Narrative:                 Patient is a resiident at [facility], WBC=18.6, Iv rocephin, iv lr at 125cc/hr, bld cultures are + for gram neg rods.  Expected Discharge Plan: Assisted Living Barriers to Discharge: Continued Medical Work up   Patient Goals and CMS Choice Patient states their goals for this hospitalization and ongoing recovery are:: to go home CMS Medicare.gov Compare Post Acute Care list provided to:: Patient    Expected Discharge Plan and Services Expected Discharge Plan: Assisted Living   Discharge Planning Services: CM Consult   Living arrangements for the past 2 months: Assisted Living Facility                                      Prior Living Arrangements/Services Living arrangements for the past 2 months: Assisted Living Facility Lives with:: Facility Resident Patient language and need for interpreter reviewed:: No Do you feel safe going back to the place where you live?: Yes      Need for Family Participation in Patient Care: Yes (Comment) Care giver support system in place?: Yes (comment)   Criminal Activity/Legal Involvement Pertinent to Current Situation/Hospitalization: No - Comment as needed  Activities of Daily Living      Permission Sought/Granted                  Emotional Assessment Appearance:: Appears stated age Attitude/Demeanor/Rapport: Engaged Affect (typically observed): Calm Orientation: : Oriented to Self, Oriented to Place, Oriented to  Time, Oriented to Situation Alcohol / Substance Use: Not Applicable Psych Involvement: No (comment)  Admission diagnosis:  Pyelonephritis [N12] Severe sepsis (HCC) [A41.9, R65.20] Diarrhea, unspecified type [R19.7] Sepsis, due to  unspecified organism, unspecified whether acute organ dysfunction present [facility]) [A41.9] Patient Active Problem List   Diagnosis Date Noted  . Severe sepsis (HCC) 12/14/2019  . HTN (hypertension) 12/14/2019  . Dementia (HCC) 12/14/2019  . Impaired mobility and ADLs 12/14/2019  . AKI (acute kidney injury) (HCC) 12/14/2019   PCP:  System, Pcp Not In Pharmacy:  No Pharmacies Listed    Social Determinants of Health (SDOH) Interventions    Readmission Risk Interventions No flowsheet data found.

## 2019-12-15 NOTE — Plan of Care (Signed)
  Problem: Clinical Measurements: Goal: Ability to maintain clinical measurements within normal limits will improve Outcome: Progressing Goal: Diagnostic test results will improve Outcome: Progressing   

## 2019-12-15 NOTE — Progress Notes (Signed)
PHARMACY - PHYSICIAN COMMUNICATION CRITICAL VALUE ALERT - BLOOD CULTURE IDENTIFICATION (BCID)  Clifford Dennis is an 79 y.o. male who presented to [facility] on 12/14/2019 with a chief complaint of Fall, Nausea  Assessment:  Patient with BCID noted below and noted most likely UTI in source. (include suspected source if known)  Name of physician (or Provider) Contacted: X. Blount  Current antibiotics: Vancomycin, cefepime, flagyl   Changes to prescribed antibiotics recommended:  Recommendations accepted by provider  Narrow to ceftriaxone 2gm iv q24hr  Results for orders placed or performed during the hospital encounter of 12/14/19  Blood Culture ID Panel (Reflexed) (Collected: 12/14/2019  7:25 AM)  Result Value Ref Range   Enterococcus species NOT DETECTED NOT DETECTED   Listeria monocytogenes NOT DETECTED NOT DETECTED   Staphylococcus species DETECTED (A) NOT DETECTED   Staphylococcus aureus (BCID) NOT DETECTED NOT DETECTED   Methicillin resistance NOT DETECTED NOT DETECTED   Streptococcus species NOT DETECTED NOT DETECTED   Streptococcus agalactiae NOT DETECTED NOT DETECTED   Streptococcus pneumoniae NOT DETECTED NOT DETECTED   Streptococcus pyogenes NOT DETECTED NOT DETECTED   Acinetobacter baumannii NOT DETECTED NOT DETECTED   Enterobacteriaceae species DETECTED (A) NOT DETECTED   Enterobacter cloacae complex NOT DETECTED NOT DETECTED   Escherichia coli NOT DETECTED NOT DETECTED   Klebsiella oxytoca NOT DETECTED NOT DETECTED   Klebsiella pneumoniae NOT DETECTED NOT DETECTED   Proteus species DETECTED (A) NOT DETECTED   Serratia marcescens NOT DETECTED NOT DETECTED   Carbapenem resistance NOT DETECTED NOT DETECTED   Haemophilus influenzae NOT DETECTED NOT DETECTED   Neisseria meningitidis NOT DETECTED NOT DETECTED   Pseudomonas aeruginosa NOT DETECTED NOT DETECTED   Candida albicans NOT DETECTED NOT DETECTED   Candida glabrata NOT DETECTED NOT DETECTED   Candida krusei NOT  DETECTED NOT DETECTED   Candida parapsilosis NOT DETECTED NOT DETECTED   Candida tropicalis NOT DETECTED NOT DETECTED    Clifford Dennis 12/15/2019  5:58 AM

## 2019-12-15 NOTE — Progress Notes (Addendum)
PROGRESS NOTE    Clifford Dennis  VOZ:[id number] DOB: 05-Apr-1941 DOA: 12/14/2019 PCP: System, Pcp Not In   Chef Complaints: Fall weakness and fever   Brief Narrative: As per admitting:[name] with PMH HTN,HLD,dementia,gout from [facility] in after a recurrent fall.He was just in the ER on 5/21 after a fall as well, also striking his head. The CT of his head revealed moderate cerebral atrophy but no acute bleed.  CT of his C-spine also was negative for acute injuries.  X-ray of right knee also negative.   On 5/23 he stated that he hit his right arm but did not fall to the ground nor strike his head.  He is a difficult historian and most of HPI is obtained from chart review. On 12/12/2019 it was reported that he was having dizziness and weakness more so on the left side since Saturday of last week.  He had no fevers vomiting or diarrhea during that assessment.  There was some reported decreased p.o. intake.  Urinalysis and lab work-up were relatively unremarkable and after imaging studies, he was back to his mental baseline and he was discharged back to his ALF. He did require a straight catheterization due to difficulty voiding however prior to being discharged back.  In ED:he was very lethargic and falls asleep easily trying to talk.He appears weak throughout.There is report of some vomiting and diarrhea now with vague abdominal pain.He is also endorsing some irritation with voiding this morning.  He was febrile,101.8,tachycardic 142,tachypneic 22, and initial BP 162/78 however this slowly down trended to 109/65 during assessment in the ER.  He also became hypoxic on room air satting down into the 80s and was placed on 2 L [place]. Notable labs include: Lactic acid 4.8,BUN 16,creatinine 1.43 WBC 3.2,94% neutrophils.UA, large Hgb, 100 protein,positive nitrites, moderate leukocyte esterase, greater than 50 WBC. In the ER:he was given 3-4 L NS BS. Patient was admitted for further  management.  Subjective: Seen this morning. On restraint, asking to remove it and wants to get dressed up.  He is alert awake knows where he is at but unable to tell me current month day. Denies any nausea vomiting chest pain.  He is on 2 l [place] HR 100-120s  Assessment & Plan:  Severe sepsis with Proteus bacteremia from UTI. coag nega staph- likley contamination Patient initially hypotensive in 80s has received 4 L IV fluids in ED. Urine culture  proteus +.Abx changed to Rocephin -per pharmacy and dosing appropriately.  Am Cortisol is up-40.2.  Patient is hemodynamically stable, on room air, with mild tachycardia. Does have elevated leukocyte count.  Continue on IV fluids gently monitor closely. Mild peri-cholecyttic fluid in the CT scan so will obtain right upper quadrant ultrasound, doubt acute cholecystitis.  Proteus UTI continue ceftriaxone as above.  Lactic acidosis/Metabolic acidosis with anion gap at 11,hco3 low at 19- Cont ivf. Bp stable. Lactic acid improving.  QQV:ZDGLOV hemodynamics/volume mediated 2/2 Sepsis/volume depletion .  Continue IV fluids monitor closely hold lisinopril Metformin Recent Labs  Lab 12/12/19 1602 12/14/19 0725 12/15/19 0435  BUN 18 16 20   CREATININE 1.19 1.43* 1.48*   Essential hypertension:BP stable in 130s this am.  Holding home lisinopril in lieu of sepsis/AKI.  Type 2 diabetes mellitus on Metformin at home continue to hold p.o. meds continue sliding scale insulin. Check hemoglobin A1c, and SSI every 6 hours No results found for: HGBA1C No results for input(s): GLUCAP in the last 168 hours.  Coagulopathy INR 1.4.Likely from  sepsis.  Monitor.  Acute metabolic encephalopathy with Dementia/anxiety/depression:likely from sepsis.At baseline AAOx2, agitated thsi am and did not recognize son per son. Was calm yesterday. Has very poor short term memory but good long term memory. Patient appears alert awake oriented to place, current president. Ccontinue  bupropion/Celexa.  Slowly resume home Xanax, Neurontin.  Impaired mobility and ADLs:Moved to ALF 3 wks ago and has declined rapidly as per son.He stopped eating since moving, PT/OT eval. patient has had frequent falls at ALF.Consult dietitian.  Hyperlipidemia: resume statin  GOC:DNR.Son thinks ' my father is trying to shut down" his wife and son passed away past years.  DVT prophylaxis:Heparin SQ. Code Status: DNR per admission Family Communication: plan of care discussed with patient at bedside.  I updated patient's son over the phone extensively.    Status is: Inpatient Remains inpatient appropriate because:Altered mental status, IV treatments appropriate due to intensity of illness or inability to take PO, Inpatient level of care appropriate due to severity of illness and For ongoing treatment of sepsis, bacteremia  Dispo: The patient is from: Home              Anticipated d/c is to: TBD              Anticipated d/c date is: 2 days              Patient currently is not medically stable to d/c.  Diet Order            Diet NPO time specified Except for: Sips with Meds, Ice Chips  Diet effective now              Body mass index is 31.32 kg/m.  Consultants:see note  Procedures:  CT Abdomen 1.No radiopaque gallstones or biliary ductal dilation is seen, however there is a small amount of pericholecystic fluid, which may be seen in early acute cholecystitis. 2. Bilateral perirenal fat stranding, nonspecific. Please correlate to urinalysis. 3. Masslike thickening at the ileocecal junction may represent collapsed bowel, however true soft tissue mass cannot be excluded. Please correlate to colonoscopy when clinically feasible. 4. Slight enlargement of the prostate gland. 5. Small hiatal hernia.  Microbiology:see note  Medications: Scheduled Meds: . buPROPion  100 mg Oral Daily  . Chlorhexidine Gluconate Cloth  6 each Topical Daily  . diphenhydrAMINE  12.5 mg Intravenous Once   . escitalopram  10 mg Oral Daily  . heparin  5,000 Units Subcutaneous Q8H  . pantoprazole  40 mg Oral Daily  . sodium chloride flush  3 mL Intravenous Q12H  . valACYclovir  500 mg Oral Daily   Continuous Infusions: . cefTRIAXone (ROCEPHIN)  IV Stopped (12/15/19 0644)  . lactated ringers 125 mL/hr at 12/14/19 2236  . magnesium sulfate bolus IVPB 4 g (12/15/19 0647)    Antimicrobials: Anti-infectives (From admission, onward)   Start     Dose/Rate Route Frequency Ordered Stop   12/15/19 1300  vancomycin (VANCOCIN) IVPB 1000 mg/200 mL premix  Status:  Discontinued     1,000 mg 200 mL/hr over 60 Minutes Intravenous Every 24 hours 12/14/19 1237 12/15/19 0526   12/15/19 0600  cefTRIAXone (ROCEPHIN) 2 g in sodium chloride 0.9 % 100 mL IVPB     2 g 200 mL/hr over 30 Minutes Intravenous Every 24 hours 12/15/19 0527     12/14/19 2000  ceFEPIme (MAXIPIME) 2 g in sodium chloride 0.9 % 100 mL IVPB  Status:  Discontinued     2 g 200 mL/hr over  30 Minutes Intravenous Every 12 hours 12/14/19 1210 12/15/19 0526   12/14/19 1600  metroNIDAZOLE (FLAGYL) IVPB 500 mg  Status:  Discontinued     500 mg 100 mL/hr over 60 Minutes Intravenous Every 8 hours 12/14/19 1210 12/15/19 0526   12/14/19 1415  valACYclovir (VALTREX) tablet 500 mg     500 mg Oral Daily 12/14/19 1412     12/14/19 1230  vancomycin (VANCOREADY) IVPB 2000 mg/400 mL     2,000 mg 200 mL/hr over 120 Minutes Intravenous  Once 12/14/19 1223 12/14/19 1655   12/14/19 0830  ceFEPIme (MAXIPIME) 2 g in sodium chloride 0.9 % 100 mL IVPB     2 g 200 mL/hr over 30 Minutes Intravenous  Once 12/14/19 0815 12/14/19 1117   12/14/19 0830  metroNIDAZOLE (FLAGYL) IVPB 500 mg     500 mg 100 mL/hr over 60 Minutes Intravenous  Once 12/14/19 0815 12/14/19 1117    Objective: Vitals: Today's Vitals   12/14/19 2322 12/14/19 2344 12/15/19 0000 12/15/19 0400  BP:      Pulse:      Resp:      Temp: 98.2 F (36.8 C)  98.6 F (37 C) (!) 97.5 F (36.4 C)   TempSrc: Oral  Axillary Oral  SpO2:      Weight:      Height:      PainSc:  2       Intake/Output Summary (Last 24 hours) at 12/15/2019 0755 Last data filed at 12/15/2019 0400 Gross per 24 hour  Intake --  Output 1500 ml  Net -1500 ml   Filed Weights   12/14/19 0819  Weight: 90.7 kg   Weight change:    Intake/Output from previous day: 05/23 0701 - 05/24 0700 In: -  Out: 1500 [Urine:1500] Intake/Output this shift: No intake/output data recorded.  Examination:  General exam: AA oriented to place, current year, current president but not to month/day, on 2 L nasal cannula, weak appearing. HEENT:Oral mucosa dry, Ear/Nose WNL grossly,dentition normal. Respiratory system: bilaterally clear,no wheezing or crackles,no use of accessory muscle, non tender. Cardiovascular system: S1 & S2 +, regular, No JVD. Gastrointestinal system: Abdomen soft, NT,ND, BS+. Nervous System:Alert, awake, x2 moving extremities and following commands appropriately and grossly nonfocal Extremities: No edema, distal peripheral pulses palpable.  Skin: No rashes,no icterus. MSK: Normal muscle bulk,tone, power  Data Reviewed: I have personally reviewed following labs and imaging studies CBC: Recent Labs  Lab 12/12/19 1602 12/14/19 0725 12/15/19 0435  WBC 9.1 3.2* 18.6*  NEUTROABS 7.1 2.9 17.1*  HGB 12.8* 13.7 11.8*  HCT 39.3 42.8 36.4*  MCV 86.8 87.9 88.8  PLT 228 246 144*   Basic Metabolic Panel: Recent Labs  Lab 12/12/19 1602 12/14/19 0725 12/15/19 0435  NA 134* 136 135  K 4.4 4.0 3.9  CL 97* 100 105  CO2 27 21* 19*  GLUCOSE 65* 158* 91  BUN CREATININE 1.19 1.43* 1.48*  CALCIUM 9.2 9.0 7.5*  MG  --   --  0.7*   GFR: Estimated Creatinine Clearance: 44.2 mL/min (A) (by C-G formula based on SCr of 1.48 mg/dL (H)). Liver Function Tests: Recent Labs  Lab 12/12/19 1602 12/14/19 0725  AST 21 25  ALT 16 17  ALKPHOS 79 109  BILITOT 0.9 1.5*  PROT 6.6 6.7  ALBUMIN 4.0 4.0    Recent Labs  Lab 12/14/19 0725  LIPASE 39   No results for input(s): AMMONIA in the last 168 hours. Coagulation Profile: Recent  Labs  Lab 12/14/19 0725 12/15/19 0435  INR 1.2 1.4*   Cardiac Enzymes: No results for input(s): CKTOTAL, CKMB, CKMBINDEX, TROPONINI in the last 168 hours. BNP (last 3 results) No results for input(s): PROBNP in the last 8760 hours. HbA1C: No results for input(s): HGBA1C in the last 72 hours. CBG: No results for input(s): GLUCAP in the last 168 hours. Lipid Profile: No results for input(s): CHOL, HDL, LDLCALC, TRIG, CHOLHDL, LDLDIRECT in the last 72 hours. Thyroid Function Tests: No results for input(s): TSH, T4TOTAL, FREET4, T3FREE, THYROIDAB in the last 72 hours. Anemia Panel: No results for input(s): VITAMINB12, FOLATE, FERRITIN, TIBC, IRON, RETICCTPCT in the last 72 hours. Sepsis Labs: Recent Labs  Lab 12/14/19 1638 12/14/19 2055 12/15/19 0030 12/15/19 0435  PROCALCITON  --   --   --  97.64  LATICACIDVEN 2.4* 3.3* 3.3* 2.7*    Recent Results (from the past 240 hour(s))  Urine Culture     Status: None   Collection Time: 12/12/19  3:36 PM   Specimen: Urine, Random  Result Value Ref Range Status   Specimen Description   Final    URINE, RANDOM Performed at [facility], [address] [place], [place] [address]    Special Requests   Final    NONE Performed at [facility], [address] [place], [place] [address]    Culture   Final    NO GROWTH Performed at [facility], [address] [place], [place] [address]    Report Status 12/14/2019 FINAL  Final  Blood Culture (routine x 2)     Status: None (Preliminary result)   Collection Time: 12/14/19  7:25 AM   Specimen: BLOOD  Result Value Ref Range Status   Specimen Description   Final    BLOOD LEFT ANTECUBITAL Performed at [facility], [address] [place], [place] [address]    Special Requests   Final     BOTTLES DRAWN AEROBIC ONLY Blood Culture adequate volume Performed at [facility], [address] [place], [place] [address]    Culture  Setup Time   Final    GRAM NEGATIVE RODS GRAM POSITIVE COCCI IN CLUSTERS AEROBIC BOTTLE ONLY CRITICAL RESULT CALLED TO, READ BACK BY AND VERIFIED WITH: Trixie DeisJ. GRIMSLEY,PHARMD [id number] 12/15/2019 Girtha Hake. TYSOR Performed at [facility], [address] [place], [place] [address]    Culture Romie MinusGRAM NEGATIVE RODS GRAM POSITIVE COCCI   Final   Report Status PENDING  Incomplete  Blood Culture (routine x 2)     Status: None (Preliminary result)   Collection Time: 12/14/19  7:25 AM   Specimen: BLOOD  Result Value Ref Range Status   Specimen Description   Final    BLOOD BLOOD RIGHT FOREARM Performed at [facility], [address] [place], [place] [address]    Special Requests   Final    BOTTLES DRAWN AEROBIC AND ANAEROBIC Blood Culture adequate volume Performed at [facility], [address] [place], [place] [address]    Culture  Setup Time   Final    GRAM NEGATIVE RODS CRITICAL VALUE NOTED.  VALUE IS CONSISTENT WITH PREVIOUSLY REPORTED AND CALLED VALUE. IN BOTH AEROBIC AND ANAEROBIC BOTTLES Performed at [facility], [address] [place], [place] [address]    Culture GRAM NEGATIVE RODS  Final   Report Status PENDING  Incomplete  Blood Culture ID Panel (Reflexed)     Status:  Abnormal   Collection Time: 12/14/19  7:25 AM  Result Value Ref Range Status   Enterococcus species NOT DETECTED NOT DETECTED Final   Listeria monocytogenes NOT DETECTED NOT DETECTED Final   Staphylococcus species DETECTED (A) NOT DETECTED Final    Comment: Methicillin (oxacillin) susceptible coagulase negative staphylococcus. Possible blood culture contaminant (unless isolated from more than one blood culture draw or clinical case suggests pathogenicity). No antibiotic treatment is indicated for blood  culture  contaminants. CRITICAL RESULT CALLED TO, READ BACK BY AND VERIFIED WITH: J. GRIMSLEY,PHARMD 0518 12/15/2019 T. TYSOR    Staphylococcus aureus (BCID) NOT DETECTED NOT DETECTED Final   Methicillin resistance NOT DETECTED NOT DETECTED Final   Streptococcus species NOT DETECTED NOT DETECTED Final   Streptococcus agalactiae NOT DETECTED NOT DETECTED Final   Streptococcus pneumoniae NOT DETECTED NOT DETECTED Final   Streptococcus pyogenes NOT DETECTED NOT DETECTED Final   Acinetobacter baumannii NOT DETECTED NOT DETECTED Final   Enterobacteriaceae species DETECTED (A) NOT DETECTED Final    Comment: Enterobacteriaceae represent a large family of gram-negative bacteria, not a single organism. CRITICAL RESULT CALLED TO, READ BACK BY AND VERIFIED WITH: J. GRIMSLEY,PHARMD 0518 12/15/2019 T. TYSOR    Enterobacter cloacae complex NOT DETECTED NOT DETECTED Final   Escherichia coli NOT DETECTED NOT DETECTED Final   Klebsiella oxytoca NOT DETECTED NOT DETECTED Final   Klebsiella pneumoniae NOT DETECTED NOT DETECTED Final   Proteus species DETECTED (A) NOT DETECTED Final    Comment: CRITICAL RESULT CALLED TO, READ BACK BY AND VERIFIED WITH: J. GRIMSLEY,PHARMD 0518 12/15/2019 T. TYSOR    Serratia marcescens NOT DETECTED NOT DETECTED Final   Carbapenem resistance NOT DETECTED NOT DETECTED Final   Haemophilus influenzae NOT DETECTED NOT DETECTED Final   Neisseria meningitidis NOT DETECTED NOT DETECTED Final   Pseudomonas aeruginosa NOT DETECTED NOT DETECTED Final   Candida albicans NOT DETECTED NOT DETECTED Final   Candida glabrata NOT DETECTED NOT DETECTED Final   Candida krusei NOT DETECTED NOT DETECTED Final   Candida parapsilosis NOT DETECTED NOT DETECTED Final   Candida tropicalis NOT DETECTED NOT DETECTED Final    Comment: Performed at [facility], [address] [place], [place] [address]  SARS Coronavirus 2 by RT PCR (hospital order, performed in [facility] hospital lab)  Nasopharyngeal Nasopharyngeal Swab     Status: None   Collection Time: 12/14/19 11:02 AM   Specimen: Nasopharyngeal Swab  Result Value Ref Range Status   SARS Coronavirus 2 NEGATIVE NEGATIVE Final    Comment: (NOTE) SARS-CoV-2 target nucleic acids are NOT DETECTED. The SARS-CoV-2 RNA is generally detectable in upper and lower respiratory specimens during the acute phase of infection. The lowest concentration of SARS-CoV-2 viral copies this assay can detect is 250 copies / mL. A negative result does not preclude SARS-CoV-2 infection and should not be used as the sole basis for treatment or other patient management decisions.  A negative result may occur with improper specimen collection / handling, submission of specimen other than nasopharyngeal swab, presence of viral mutation(s) within the areas targeted by this assay, and inadequate number of viral copies (<250 copies / mL). A negative result must be combined with clinical observations, patient history, and epidemiological information. Fact Sheet for Patients:   BoilerBrush.com.cy Fact Sheet for Healthcare Providers: https://pope.com/ This test is not yet approved or cleared  by the [place] FDA and has been authorized for detection and/or diagnosis of SARS-CoV-2 by FDA under an Emergency Use Authorization (EUA).  This  EUA will remain in effect (meaning this test can be used) for the duration of the COVID-19 declaration under Section 564(b)(1) of the Act, 21 U.S.C. section 360bbb-3(b)(1), unless the authorization is terminated or revoked sooner. Performed at [facility], [place] [address] [place], [place] [address]   MRSA PCR Screening     Status: None   Collection Time: 12/14/19  8:28 PM   Specimen: Nasal Mucosa; Nasopharyngeal  Result Value Ref Range Status   MRSA by PCR NEGATIVE NEGATIVE Final    Comment:        The GeneXpert MRSA Assay (FDA approved  for NASAL specimens only), is one component of a comprehensive MRSA colonization surveillance program. It is not intended to diagnose MRSA infection nor to guide or monitor treatment for MRSA infections. Performed at [facility], [place] [address] [place], [place] [address]       Radiology Studies: CT Abdomen Pelvis W Contrast  Result Date: 12/14/2019 CLINICAL DATA:  Nausea vomiting. EXAM: CT ABDOMEN AND PELVIS WITH CONTRAST TECHNIQUE: Multidetector CT imaging of the abdomen and pelvis was performed using the standard protocol following bolus administration of intravenous contrast. CONTRAST:  144mL OMNIPAQUE IOHEXOL 300 MG/ML  SOLN COMPARISON:  None. FINDINGS: Lower chest: Enlarged heart. Calcific atherosclerotic disease of the coronary arteries and aorta. Small hiatal hernia. Hepatobiliary: Normal appearance of the liver. The gallbladder is normally distended. No radiopaque gallstones are seen. However there is a small amount of pericholecystic fluid. The common bile duct is not dilated. Pancreas: Unremarkable. No pancreatic ductal dilatation or surrounding inflammatory changes. Spleen: Normal in size without focal abnormality. Adrenals/Urinary Tract: Normal adrenal glands. No evidence of hydronephrosis or nephrolithiasis. Bilateral perirenal fat stranding. Normal ureters and urinary bladder. Stomach/Bowel: Stomach is within normal limits. Appendix appears normal. No evidence of bowel wall distention, or inflammatory changes. Masslike thickening at the ileocecal junction may represent collapsed bowel, however true soft tissue mass cannot be excluded. Vascular/Lymphatic: Aortic atherosclerosis. No enlarged abdominal or pelvic lymph nodes. Reproductive: Slight enlargement of the prostate gland. Other: No abdominal wall hernia or abnormality. No abdominopelvic ascites. Musculoskeletal: Spondylosis of the lumbosacral spine. IMPRESSION: 1. No radiopaque gallstones or biliary ductal  dilation is seen, however there is a small amount of pericholecystic fluid, which may be seen in early acute cholecystitis. 2. Bilateral perirenal fat stranding, nonspecific. Please correlate to urinalysis. 3. Masslike thickening at the ileocecal junction may represent collapsed bowel, however true soft tissue mass cannot be excluded. Please correlate to colonoscopy when clinically feasible. 4. Slight enlargement of the prostate gland. 5. Small hiatal hernia. Aortic Atherosclerosis (ICD10-I70.0). Electronically Signed   By: Fidela Salisbury M.D.   On: 12/14/2019 10:44   DG Chest Port 1 View  Result Date: 12/14/2019 CLINICAL DATA:  Fevers EXAM: PORTABLE CHEST 1 VIEW COMPARISON:  None. FINDINGS: Cardiac shadow is within normal limits. Aortic calcifications are seen. The lungs are clear. No bony abnormality is noted. IMPRESSION: No acute abnormality seen. Electronically Signed   By: Inez Catalina M.D.   On: 12/14/2019 08:10     LOS: 1 day   Antonieta Pert, MD [facility]  12/15/2019, 7:55 AM

## 2019-12-16 ENCOUNTER — Inpatient Hospital Stay (HOSPITAL_COMMUNITY): Payer: Medicare Other

## 2019-12-16 LAB — GLUCOSE, CAPILLARY
Glucose-Capillary: 123 mg/dL — ABNORMAL HIGH (ref 70–99)
Glucose-Capillary: 137 mg/dL — ABNORMAL HIGH (ref 70–99)
Glucose-Capillary: 154 mg/dL — ABNORMAL HIGH (ref 70–99)
Glucose-Capillary: 81 mg/dL (ref 70–99)
Glucose-Capillary: 81 mg/dL (ref 70–99)

## 2019-12-16 LAB — BASIC METABOLIC PANEL
Anion gap: 10 (ref 5–15)
Anion gap: 13 (ref 5–15)
BUN: 21 mg/dL (ref 8–23)
BUN: 20 mg/dL (ref 8–23)
CO2: 19 mmol/L — ABNORMAL LOW (ref 22–32)
CO2: 18 mmol/L — ABNORMAL LOW (ref 22–32)
Calcium: 7.7 mg/dL — ABNORMAL LOW (ref 8.9–10.3)
Calcium: 7.6 mg/dL — ABNORMAL LOW (ref 8.9–10.3)
Chloride: 107 mmol/L (ref 98–111)
Chloride: 105 mmol/L (ref 98–111)
Creatinine, Ser: 1.17 mg/dL (ref 0.61–1.24)
Creatinine, Ser: 1.09 mg/dL (ref 0.61–1.24)
GFR calc Af Amer: >60 mL/min (ref >60)
GFR calc Af Amer: >60 mL/min (ref >60)
GFR calc non Af Amer: 59 mL/min — ABNORMAL LOW (ref >60)
GFR calc non Af Amer: >60 mL/min (ref >60)
Glucose, Bld: 87 mg/dL (ref 70–99)
Glucose, Bld: 92 mg/dL (ref 70–99)
Potassium: 3.4 mmol/L — ABNORMAL LOW (ref 3.5–5.1)
Potassium: 3.6 mmol/L (ref 3.5–5.1)
Sodium: 136 mmol/L (ref 135–145)
Sodium: 136 mmol/L (ref 135–145)

## 2019-12-16 LAB — MAGNESIUM: Magnesium: 1.3 mg/dL — ABNORMAL LOW (ref 1.7–2.4)

## 2019-12-16 LAB — URINE CULTURE: Culture: >=100000 — AB

## 2019-12-16 LAB — CBC
HCT: 35.4 % — ABNORMAL LOW (ref 39.0–52.0)
Hemoglobin: 11.9 g/dL — ABNORMAL LOW (ref 13.0–17.0)
MCH: 28.7 pg (ref 26.0–34.0)
MCHC: 33.6 g/dL (ref 30.0–36.0)
MCV: 85.5 fL (ref 80.0–100.0)
Platelets: 90 10*3/uL — ABNORMAL LOW (ref 150–400)
RBC: 4.14 MIL/uL — ABNORMAL LOW (ref 4.22–5.81)
RDW: 14.6 % (ref 11.5–15.5)
WBC: 14.7 10*3/uL — ABNORMAL HIGH (ref 4.0–10.5)
nRBC: 0 % (ref 0.0–0.2)

## 2019-12-16 LAB — LACTIC ACID, PLASMA: Lactic Acid, Venous: 1.8 mmol/L (ref 0.5–1.9)

## 2019-12-16 LAB — BRAIN NATRIURETIC PEPTIDE: B Natriuretic Peptide: 304.6 pg/mL — ABNORMAL HIGH (ref 0.0–100.0)

## 2019-12-16 LAB — TROPONIN I (HIGH SENSITIVITY): Troponin I (High Sensitivity): 213 ng/L (ref <18)

## 2019-12-16 LAB — ECHOCARDIOGRAM COMPLETE
Height: 67 in
Weight: 3200 oz

## 2019-12-16 MED ORDER — HYDRALAZINE HCL 20 MG/ML IJ SOLN
10.0000 mg | Freq: Once | INTRAMUSCULAR | Status: AC
  Administered 2019-12-16: 10 mg via INTRAVENOUS
  Filled 2019-12-16: qty 1

## 2019-12-16 MED ORDER — HYDRALAZINE HCL 20 MG/ML IJ SOLN
10.0000 mg | INTRAMUSCULAR | Status: DC | PRN
  Administered 2019-12-16: 10 mg via INTRAVENOUS
  Filled 2019-12-16: qty 1

## 2019-12-16 MED ORDER — ALBUTEROL SULFATE (2.5 MG/3ML) 0.083% IN NEBU
2.5000 mg | INHALATION_SOLUTION | RESPIRATORY_TRACT | Status: DC | PRN
  Administered 2019-12-16: 2.5 mg via RESPIRATORY_TRACT
  Filled 2019-12-16: qty 3

## 2019-12-16 MED ORDER — LABETALOL HCL 5 MG/ML IV SOLN
5.0000 mg | INTRAVENOUS | Status: DC | PRN
  Administered 2019-12-16: 5 mg via INTRAVENOUS
  Filled 2019-12-16: qty 4

## 2019-12-16 MED ORDER — LORAZEPAM 2 MG/ML IJ SOLN
0.5000 mg | Freq: Once | INTRAMUSCULAR | Status: AC
  Administered 2019-12-16: 0.5 mg via INTRAVENOUS
  Filled 2019-12-16: qty 1

## 2019-12-16 MED ORDER — MAGNESIUM SULFATE 4 GM/100ML IV SOLN
4.0000 g | Freq: Once | INTRAVENOUS | Status: AC
  Administered 2019-12-16: 4 g via INTRAVENOUS
  Filled 2019-12-16: qty 100

## 2019-12-16 MED ORDER — PANTOPRAZOLE SODIUM 40 MG IV SOLR
40.0000 mg | INTRAVENOUS | Status: DC
  Administered 2019-12-16 – 2019-12-22 (×7): 40 mg via INTRAVENOUS
  Filled 2019-12-16 (×7): qty 40

## 2019-12-16 MED ORDER — PIPERACILLIN-TAZOBACTAM 3.375 G IVPB
3.3750 g | Freq: Three times a day (TID) | INTRAVENOUS | Status: DC
  Administered 2019-12-16 – 2019-12-22 (×18): 3.375 g via INTRAVENOUS
  Filled 2019-12-16 (×17): qty 50

## 2019-12-16 MED ORDER — POTASSIUM CHLORIDE 10 MEQ/100ML IV SOLN
10.0000 meq | INTRAVENOUS | Status: AC
  Administered 2019-12-16 (×2): 10 meq via INTRAVENOUS
  Filled 2019-12-16 (×2): qty 100

## 2019-12-16 MED ORDER — METHYLPREDNISOLONE SODIUM SUCC 125 MG IJ SOLR
60.0000 mg | Freq: Once | INTRAMUSCULAR | Status: AC
  Administered 2019-12-16: 60 mg via INTRAVENOUS

## 2019-12-16 MED ORDER — FUROSEMIDE 10 MG/ML IJ SOLN
60.0000 mg | Freq: Once | INTRAMUSCULAR | Status: AC
  Administered 2019-12-16: 60 mg via INTRAVENOUS
  Filled 2019-12-16: qty 6

## 2019-12-16 NOTE — Progress Notes (Signed)
PT Cancellation Note  Patient Details Name: Clifford Dennis MRN: [id number] DOB: [birth year]   Cancelled Treatment:    Reason Eval/Treat Not Completed: Medical issues which prohibited therapy, in soft wrist restraints, noted Palliative to evaluate. Will see what GOC will be.    Rada Hay 12/16/2019, 1:58 PM Blanchard Kelch PT Acute Rehabilitation Services Pager [phone] Office [phone]

## 2019-12-16 NOTE — Progress Notes (Signed)
Pt had a 9 beat run of vtach and a hr increase to 145 non sustaining. MD made aware no new orders at this time.

## 2019-12-16 NOTE — Progress Notes (Signed)
  Echocardiogram 2D Echocardiogram has been performed.  Pieter Partridge 12/16/2019, 12:09 PM

## 2019-12-16 NOTE — Evaluation (Signed)
SLP Cancellation Note  Patient Details Name: Clifford Dennis MRN: [id number] DOB: 12-May-1941   Cancelled treatment:       Reason Eval/Treat Not Completed: Other (comment)(pt remains not ready for eval/po, will continue efforts)   Chales Abrahams 12/16/2019, 5:40 PM  Rolena Infante, MS [facility] SLP Acute Rehab Services Office [phone]

## 2019-12-16 NOTE — Progress Notes (Signed)
Gave apresoline 10mg  for BP 200/88. Will recheck and continue to monitor.

## 2019-12-16 NOTE — Consult Note (Signed)
CARDIOLOGY CONSULT NOTE  Patient ID: Clifford Dennis MRN: [id number] DOB/AGE: Sep 15, 1940 79 y.o.  Admit date: 12/14/2019 Referring Physician  Lanae Boast, MD Primary Physician:  System, Pcp Not In Reason for Consultation  NSVT, Pulmonary edema  Patient ID: Clifford Dennis, male    DOB: 1941/07/13, 79 y.o.   MRN: [id number]  Chief Complaint  Patient presents with  . Fall  . Nausea   HPI:    Clifford Dennis  is a 79 y.o. Caucasian male with severe dementia, hypertension, hyperlipidemia, admitted to the hospital with urosepsis.  Came in with septic shock and hypotension, received 4 L of fluid resuscitation.  This morning developed acute respiratory distress, wheezing, chest x-ray revealed pulmonary edema versus infiltrates suggestive of pneumonia, patient also had 8 beats of nonsustained VT and hence I was consulted.  Patient is very drowsy and unarousable and responds only by stating yes or no.  Could not get much history and history is obtained by chart review.  Patient and [facility] unit from [facility].  Has history of frequent fall.  Past Medical History:  Diagnosis Date  . Dementia (HCC)   . Gout   . Hyperlipidemia   . Hypertension    History reviewed. No pertinent surgical history. Social History   Socioeconomic History  . Marital status: Widowed    Spouse name: Not on file  . Number of children: Not on file  . Years of education: Not on file  . Highest education level: Not on file  Occupational History  . Not on file  Tobacco Use  . Smoking status: Never Smoker  . Smokeless tobacco: Never Used  Substance and Sexual Activity  . Alcohol use: Not on file  . Drug use: Not on file  . Sexual activity: Not on file  Other Topics Concern  . Not on file  Social History Narrative  . Not on file   Social Determinants of Health   Financial Resource Strain:   . Difficulty of Paying Living Expenses:   Food Insecurity:   . Worried About Programme researcher, broadcasting/film/video in the Last Year:    . Barista in the Last Year:   Transportation Needs:   . Freight forwarder (Medical):   Marland Kitchen Lack of Transportation (Non-Medical):   Physical Activity:   . Days of Exercise per Week:   . Minutes of Exercise per Session:   Stress:   . Feeling of Stress :   Social Connections:   . Frequency of Communication with Friends and Family:   . Frequency of Social Gatherings with Friends and Family:   . Attends Religious Services:   . Active Member of Clubs or Organizations:   . Attends Banker Meetings:   Marland Kitchen Marital Status:   Intimate Partner Violence:   . Fear of Current or Ex-Partner:   . Emotionally Abused:   Marland Kitchen Physically Abused:   . Sexually Abused:    ROS  Review of Systems  Unable to perform ROS: dementia   Objective   Vitals with BMI 12/16/2019 12/16/2019 12/16/2019  Height - - -  Weight - - -  BMI - - -  Systolic 117 144 742  Diastolic 70 76 87  Pulse 87 94 114    Blood pressure 117/70, pulse 87, temperature 98.2 F (36.8 C), temperature source Axillary, resp. rate (!) 23, height 5\' 7"  (1.702 m), weight 90.7 kg, SpO2 98 %.    Physical Exam  Constitutional: He appears well-developed and well-nourished. He appears  lethargic.  Cardiovascular: Normal rate, regular rhythm, normal heart sounds and intact distal pulses. Exam reveals no gallop.  No murmur heard. No leg edema, no JVD.  Pulmonary/Chest: Effort normal and breath sounds normal.  Abdominal: Soft. Bowel sounds are normal.  Neurological: He appears lethargic.   Laboratory examination:   Recent Labs    12/15/19 0435 12/16/19 0246 12/16/19 0715  NA 135 136 136  K 3.9 3.4* 3.6  CL 105 107 105  CO2 19* 19* 18*  GLUCOSE 91 87 92  BUN 20 21 20   CREATININE 1.48* 1.17 1.09  CALCIUM 7.5* 7.7* 7.6*  GFRNONAA 45* 59* >60  GFRAA 52* >60 >60   estimated creatinine clearance is 60 mL/min (by C-G formula based on SCr of 1.09 mg/dL).  CMP Latest Ref Rng & Units 12/16/2019 12/16/2019 12/15/2019   Glucose 70 - 99 mg/dL 92 87 91  BUN 8 - 23 mg/dL 20 21 20   Creatinine 0.61 - 1.24 mg/dL 12/17/2019 4.31)  Sodium 135 - 145 mmol/L 136 136 135  Potassium 3.5 - 5.1 mmol/L 3.6 3.4(L) 3.9  Chloride 98 - 111 mmol/L 105 107 105  CO2 22 - 32 mmol/L 18(L) 19(L) 19(L)  Calcium 8.9 - 10.3 mg/dL 7.6(L) 7.7(L) 7.5(L)  Total Protein 6.5 - 8.1 g/dL - - -  Total Bilirubin 0.3 - 1.2 mg/dL - - -  Alkaline Phos 38 - 126 U/L - - -  AST 15 - 41 U/L - - -  ALT 0 - 44 U/L - - -   CBC Latest Ref Rng & Units 12/16/2019 12/15/2019 12/14/2019  WBC 4.0 - 10.5 K/uL 14.7(H) 18.6(H) 3.2(L)  Hemoglobin 13.0 - 17.0 g/dL 11.9(L) 11.8(L) 13.7  Hematocrit 39.0 - 52.0 % 35.4(L) 36.4(L) 42.8  Platelets 150 - 400 K/uL 90(L) 144(L) 246   Lipid Panel  No results found for: CHOL, TRIG, HDL, CHOLHDL, VLDL, LDLCALC, LDLDIRECT HEMOGLOBIN A1C Lab Results  Component Value Date   HGBA1C 6.6 (H) 12/15/2019   MPG 142.72 12/15/2019   TSH No results for input(s): TSH in the last 8760 hours. BNP (last 3 results) Recent Labs    12/16/19 0850  BNP 304.6*    Medications and allergies  No Known Allergies    . lactated ringers 30 mL/hr at 12/16/19 0821  . piperacillin-tazobactam (ZOSYN)  IV 3.375 g (12/16/19 1151)    Current Outpatient Medications  Medication Instructions  . allopurinol (ZYLOPRIM) 300 mg, Oral, Daily  . ALPRAZolam (XANAX) 0.5 mg, Oral, 3 times daily PRN  . aspirin EC 81 mg, Oral, Daily  . atorvastatin (LIPITOR) 20 mg, Oral, Every evening  . buPROPion (WELLBUTRIN SR) 100 mg, Oral, Daily  . escitalopram (LEXAPRO) 10 mg, Oral, Daily  . gabapentin (NEURONTIN) 300 mg, Oral, 2 times daily  . lisinopril (ZESTRIL) 5 mg, Oral, Daily  . metFORMIN (GLUCOPHAGE) 500 mg, Oral, 2 times daily with meals  . omeprazole (PRILOSEC) 20 mg, Oral, Daily  . valACYclovir (VALTREX) 500 mg, Oral, Daily    I/O last 3 completed shifts: In: 2333.9 [P.O.:60; I.V.:2150.9; IV Piggyback:123] Out: 2100 [Urine:2100] Total  I/O In: -  Out: 1850 [Urine:1850]    Radiology:   Imaging:  [facility] Chest Port 1 View  Result Date: 12/16/2019 CLINICAL DATA:  Hypoxia. EXAM: PORTABLE CHEST 1 VIEW COMPARISON:  Dec 14, 2019. FINDINGS: Stable cardiomediastinal silhouette. No pneumothorax or pleural effusion is noted. New large bilateral lung opacities are noted, right greater than left, consistent with multifocal pneumonia or possibly edema. Bony thorax is unremarkable.  IMPRESSION: New large bilateral lung opacities are noted, right greater than left, consistent with multifocal pneumonia or possibly edema. Aortic Atherosclerosis (ICD10-I70.0). Electronically Signed   By: Marijo Conception M.D.   On: 12/16/2019 08:31   US Abdomen Limited RUQ  Result Date: 12/15/2019 CLINICAL DATA:  Pericholecystic fluid on CT. EXAM: ULTRASOUND ABDOMEN LIMITED RIGHT UPPER QUADRANT COMPARISON:  CT abdomen pelvis from yesterday. FINDINGS: Gallbladder: No gallstones or wall thickening visualized. No pericholecystic fluid. No sonographic Murphy sign noted by sonographer. Common bile duct: Diameter: 3 mm, normal. Liver: No focal lesion identified. Within normal limits in parenchymal echogenicity. Portal vein is patent on color Doppler imaging with normal direction of blood flow towards the liver. Other: None. IMPRESSION: 1. Normal right upper quadrant ultrasound. Pericholecystic fluid has resolved. Electronically Signed   By: Titus Dubin M.D.   On: 12/15/2019 12:48    Cardiac Studies:   Echocardiogram 12/16/2019: Essentially normal echocardiogram.  No wall motion abnormality.  EKG 12/16/2019: Sinus tachycardia at rate of 129 bpm, poor R wave progression, cannot exclude septal infarct old.  PVCs (2).  Low-voltage complexes.  No evidence of ischemia.  Assessment   1.  Acute pulmonary edema related to aggressive fluid resuscitation. 2.  Brief NSVT without EKG evidence of ischemia, electrolyte imbalance including low potassium and magnesium.  Metabolic  derangement. 3.  Urosepsis 4.  Advanced dementia  Recommendations:  In the absence of wall motion abnormality, no EKG abnormality, as he has responded very well to IV Lasix with near complete resolution of respiratory distress, advanced dementia and underlying medical comorbidity, would not recommend any further cardiac work-up.  As he has responded well and his saturations are normal and he is not in any respiratory distress, we could hold off on further diuresis unless necessary.  Patient received labetalol for hypertension, blood pressure now well controlled and tachycardia has also resolved with improvement in oxygenation, on low-dose ACE inhibitor.  Continue same and if necessary could add a beta-blocker.  I will see him back on a as needed basis.  Adrian Prows, MD, [facility] 12/16/2019, 12:37 PM [facility] [place] Pager: [phone] Office: [phone]

## 2019-12-16 NOTE — Progress Notes (Signed)
Pharmacy Antibiotic Note  Clifford Dennis is a 79 y.o. male admitted on 12/14/2019 with pneumonia.  Pharmacy has been consulted for zosyn dosing. 12/16/2019  D#3 abx, to broaden ceftriaxone to zosyn to cover aspiration PNA as well as proteus in blood and urine cx, also covers CNS in blood (?contaminant). WBC 18.6>14.7, SCr 1.09. Lactate 2.7> 1.8 CXR: New large bilateral lung opacities are noted, right greater than left, consistent with multifocal pneumonia or possibly edema.  Plan: Zosyn 3.375g IV q8h (4 hour infusion).  Height: 5\' 7"  (170.2 cm) Weight: 90.7 kg (200 lb) IBW/kg (Calculated) : 66.1  Temp (24hrs), Avg:98.2 F (36.8 C), Min:97.6 F (36.4 C), Max:98.6 F (37 C)  Recent Labs  Lab 12/12/19 1602 12/14/19 0725 12/14/19 1310 12/14/19 1638 12/14/19 2055 12/15/19 0030 12/15/19 0435 12/16/19 0246 12/16/19 0715 12/16/19 0850  WBC 9.1 3.2*  --   --   --   --  18.6* 14.7*  --   --   CREATININE 1.19 1.43*  --   --   --   --  1.48* 1.17 1.09  --   LATICACIDVEN  --  3.4*  4.8*   < > 2.4* 3.3* 3.3* 2.7*  --   --  1.8   < > = values in this interval not displayed.    Estimated Creatinine Clearance: 60 mL/min (by C-G formula based on SCr of 1.09 mg/dL).    No Known Allergies Antimicrobials this admission: 5/23 Cefepime >> 5/24 5/23 Flagyl >> 5/24 5/23 Vancomycin>> 5/24 5/24 Ceftriaxone >>5/25 5/25 ZEI>> Dose adjustments this admission:   Microbiology results: 5/23 6/23- sens to all X NTF/Meth sens CNS 5/21 UCx NGF 5/23 UCx:  > 100 K proteus: R NTF, sens to all others 5/23 MRSA neg  Thank you for allowing pharmacy to be a part of this patient's care.  6/23, Pharm.D 12/16/2019 10:38 AM

## 2019-12-16 NOTE — Progress Notes (Signed)
Paged MD about Pt bp increase see new order.

## 2019-12-16 NOTE — Progress Notes (Signed)
PROGRESS NOTE    Pearla Dubonnetom Fitzwater  ZOX:[id number] DOB: 11/04/1940 DOA: 12/14/2019 PCP: System, Pcp Not In   Chef Complaints: fall and fever  Brief Narrative: [name] with PMH HTN,HLD,dementia,gout from [facility] sent for recurrent fall.He was just in the ER on 5/21 after a fall as well, also striking his head. The CT of his head revealed moderate cerebral atrophy but no acute bleed.  CT of his C-spine also was negative for acute injuries.  X-ray of right knee also negative.  On 5/23 he stated that he hit his right arm but did not fall to the ground nor strike his head.  He is a difficult historian and most of HPI is obtained from chart review. On 12/12/2019 it was reported that he was having dizziness and weakness more so on the left side since Saturday of last week.  There is reported history of decreased oral intake.Urinalysis and lab work-up were relatively unremarkable and after imaging studies, he was back to his mental baseline and he was discharged back to his ALF. He did require a straight catheterization due to difficulty voiding however prior to being discharged back. In ED:he was very lethargic, there is report of some vomiting and diarrhea  with vague abdominal pain.He is also endorsing some irritation with voiding. He was febrile,101.8,tachycardic 142,tachypneic 22, and initial BP 162/78 however this slowly down trended to 109/65 , also became hypoxic on room air satting down into the 80s and was placed on 2 L [place].Notable labs include: Lactic acid 4.8,BUN 16,creatinine 1.43. WBC 3.2,94% neutrophils.UA, large Hgb, 100 protein,positive nitrites, moderate leukocyte esterase, greater than 50 WBC. In the ER:he was given 3-4 L NS BS. Patient was admitted for severe sepsis/lactic acidosis/UTI.  Subjective:  BP uncontrolled up to 200s and HR in 120-130s Overnight. NSVT last night On 5-6l [place] and saturating 97-99. He is wheezing, tachypneic. Remains NPO. Confused and shaky this am On  chronic xanax at SNF. He is able to tell me his name, denies any chest pain shortness of breath Wbc better and creat improved.  Assessment & Plan:  Severe sepsis [facility], also has Proteus in the urine pansensitive except to nitrofurantoin. coag nega staph- likley contamination Patient initially hypotensive in 80s has received 4 L IV fluids in ED. lactic acid this morning improved, blood pressure on higher side.  Chest x-ray shows multifocal pneumonia versus edema question ARDS, will switch to Zosyn to cover aspiration, keep strict n.p.o.  WC count is downtrending.Mild peri-cholecyttic fluid in the CT scan which is resolved on the right upper quadrant ultrasound.    Proteus WJX:BJYNWGNFTI:continue Zosyn pharmacy dosing.   Acute hypoxic respiratory failure with acute respiratory distress/?ARDS vs Multifocal Pneumonia,  Vs asymmetric acute pulmonary edema/ acute systolic chf:he has been on 2l O2 on admission as he was hypoxic in 80s: o2 need went up overnight, CXR  Stat done, he is wheezing too.Bp high , tachypneic in low 30s- will dose with lasix x1, iv steroidx1.He reports smoking many years ago. No known COPD. f/u tropoinin ,bnp, lactic acid. Keep NPO, he is at risk for aspiration.CXR: reviewed shows multifocal pneumonia versus edema we did give him a dose of Lasix 60 mg with good output 1850 ml-with significant improvement in his status tachypnea and tachycardia improved and oxygen weaned down to 2 L patient did not appear to be in so much of distress.Monitor:Closely reassess for further Lasix needed.BNP elevated but not significantly high. check cardiac echocardiogram  follow-up cardio recommendation.  Elevated troponin:Check EKG, likely from hypoxia-demand mismatch. Consulted cardiology and discussed. tte ordered.  Hypomagnesemia- at 1.4 will replete.  Telemetry reviewed reported as torsades but artifact per cardiology  NSVT- replete mag 4 gm IV and KCL 20 MEQ.  Monitor in tele  Lactic acidosis/metabolic acidosis.  Lactic acid this am on repeat is resolved.    ZOX:WRUEAV hemodynamics/volume mediated 2/2 Sepsis/volume depletion- resolved. Cut down ivf while n.p.o. Home  lisinopril Metformin on Hold. Recent Labs  Lab 12/12/19 1602 12/14/19 0725 12/15/19 0435 12/16/19 0246  BUN CREATININE 1.19 1.43* 1.48* 1.17   Poorly controlled hypertension: Lisinopril on hold due to AKI and NPO.  Continue labetalol as needed.    Type 2 diabetes mellitus on Metformin at home remains on hold.  A1c stable 6.6.  Blood sugar stable and monitor on Sliding scale insulin while patient got steroid Recent Labs  Lab 12/15/19 1240 12/15/19 1800 12/16/19 0031 12/16/19 0639  GLUCAP 80 78 81 81   Coagulopathy INR 1.4.Likely from sepsis.  Monitor.  Acute metabolic encephalopathy with Dementia/anxiety/depression:likely from sepsis.At baseline and does not have good short-term memory with dementia per son.  He is alert awake answers questions but not yet back to baseline.  Continue to monitor and provide supportive care.  After is able to take p.o. will resume  bupropion/Celexa.  Slowly resume home Xanax, Neurontin.  Impaired mobility and ADLs:Moved to ALF 3 wks ago and has declined rapidly as per son.He stopped eating since moving, PT/OT eval. patient has had frequent falls at ALF.Consult dietitian.  Hyperlipidemia: resume statin  GOC:DNR.Son [name] father is trying to shut down" his wife and son passed away past years. Consulted palliative care.He is appreciate of palliative care consult .  Had extensive discussion. he remains DNR.  Prognosis remains to be seen  DVT prophylaxis:Heparin SQ. Code Status: DNR Family Communication: plan of care discussed with patient at bedside.  I had updated patient's son over the phone extensively.  We will update the son, nursing has spoken this morning with him.  Status is: Inpatient Remains inpatient appropriate  because:Altered mental status, IV treatments appropriate due to intensity of illness or inability to take PO, Inpatient level of care appropriate due to severity of illness and For ongoing treatment of sepsis, bacteremia  Dispo: The patient is from: Home              Anticipated d/c is to: TBD              Anticipated d/c date is: 2 days              Patient currently is not medically stable to d/c.  Nutrition- NPO until SLP eval Diet Order            Diet NPO time specified Except for: Sips with Meds, Ice Chips  Diet effective now              Body mass index is 31.32 kg/m.  Consultants:see note  Procedures:  CT Abdomen 1.No radiopaque gallstones or biliary ductal dilation is seen, however there is a small amount of pericholecystic fluid, which may be seen in early acute cholecystitis. 2. Bilateral perirenal fat stranding, nonspecific. Please correlate to urinalysis. 3. Masslike thickening at the ileocecal junction may represent collapsed bowel, however true soft tissue mass cannot be excluded. Please correlate to colonoscopy when clinically feasible. 4. Slight enlargement of the prostate gland. 5. Small hiatal hernia.  Microbiology:see note  Medications: Scheduled Meds: . allopurinol  300 mg Oral Daily  . aspirin EC  81 mg Oral Daily  . atorvastatin  20 mg Oral QPM  . buPROPion  100 mg Oral Daily  . Chlorhexidine Gluconate Cloth  6 each Topical Daily  . diphenhydrAMINE  12.5 mg Intravenous Once  . escitalopram  10 mg Oral Daily  . heparin  5,000 Units Subcutaneous Q8H  . insulin aspart  0-6 Units Subcutaneous Q6H  . pantoprazole (PROTONIX) IV  40 mg Intravenous Q24H  . sodium chloride flush  3 mL Intravenous Q12H  . valACYclovir  500 mg Oral Daily   Continuous Infusions: . cefTRIAXone (ROCEPHIN)  IV Stopped (12/16/19 0539)  . lactated ringers 125 mL/hr at 12/15/19 2352    Antimicrobials: Anti-infectives (From admission, onward)   Start     Dose/Rate Route  Frequency Ordered Stop   12/15/19 1300  vancomycin (VANCOCIN) IVPB 1000 mg/200 mL premix  Status:  Discontinued     1,000 mg 200 mL/hr over 60 Minutes Intravenous Every 24 hours 12/14/19 1237 12/15/19 0526   12/15/19 0600  cefTRIAXone (ROCEPHIN) 2 g in sodium chloride 0.9 % 100 mL IVPB     2 g 200 mL/hr over 30 Minutes Intravenous Every 24 hours 12/15/19 0527     12/14/19 2000  ceFEPIme (MAXIPIME) 2 g in sodium chloride 0.9 % 100 mL IVPB  Status:  Discontinued     2 g 200 mL/hr over 30 Minutes Intravenous Every 12 hours 12/14/19 1210 12/15/19 0526   12/14/19 1600  metroNIDAZOLE (FLAGYL) IVPB 500 mg  Status:  Discontinued     500 mg 100 mL/hr over 60 Minutes Intravenous Every 8 hours 12/14/19 1210 12/15/19 0526   12/14/19 1415  valACYclovir (VALTREX) tablet 500 mg     500 mg Oral Daily 12/14/19 1412     12/14/19 1230  vancomycin (VANCOREADY) IVPB 2000 mg/400 mL     2,000 mg 200 mL/hr over 120 Minutes Intravenous  Once 12/14/19 1223 12/14/19 1655   12/14/19 0830  ceFEPIme (MAXIPIME) 2 g in sodium chloride 0.9 % 100 mL IVPB     2 g 200 mL/hr over 30 Minutes Intravenous  Once 12/14/19 0815 12/14/19 1117   12/14/19 0830  metroNIDAZOLE (FLAGYL) IVPB 500 mg     500 mg 100 mL/hr over 60 Minutes Intravenous  Once 12/14/19 0815 12/14/19 1117    Objective: Vitals: Today's Vitals   12/16/19 0040 12/16/19 0143 12/16/19 0427 12/16/19 0554  BP: (!) 158/76  (!) 172/99   Pulse:  (!) 118 (!) 124   Resp:  (!) 29 (!) 27   Temp:   97.6 F (36.4 C)   TempSrc:   Axillary   SpO2:  94% 96% 97%  Weight:      Height:      PainSc:        Intake/Output Summary (Last 24 hours) at 12/16/2019 0807 Last data filed at 12/16/2019 0600 Gross per 24 hour  Intake 2333.88 ml  Output 1300 ml  Net 1033.88 ml   Filed Weights   12/14/19 0819  Weight: 90.7 kg   Weight change:    Intake/Output from previous day: 05/24 0701 - 05/25 0700 In: 2333.9 [P.O.:60; I.V.:2150.9; IV Piggyback:123] Out: 1300  [Urine:1300] Intake/Output this shift: No intake/output data recorded.  Examination:  General exam: Moaning and groaning, tachypneic tachycardic  HEENT:Oral mucosa moist, Ear/Nose WNL grossly, dentition normal. Respiratory system: bilaterally significant wheezing present tachycardic and use of accessory muscle and tachypneic Cardiovascular system: S1 &  S2 +, tachycardic. Gastrointestinal system: Abdomen soft, NT,ND, BS+ Nervous System: awake confused, closed answer some questions, moving extremities and grossly nonfocal Extremities: mild edema, distal peripheral pulses palpable.  Skin: No rashes,no icterus. MSK: Normal muscle bulk,tone, power  Data Reviewed: I have personally reviewed following labs and imaging studies CBC: Recent Labs  Lab 12/12/19 1602 12/14/19 0725 12/15/19 0435 12/16/19 0246  WBC 9.1 3.2* 18.6* 14.7*  NEUTROABS 7.1 2.9 17.1*  --   HGB 12.8* 13.7 11.8* 11.9*  HCT 39.3 42.8 36.4* 35.4*  MCV 86.8 87.9 88.8 85.5  PLT 228 246 144* 90*   Basic Metabolic Panel: Recent Labs  Lab 12/12/19 1602 12/14/19 0725 12/15/19 0435 12/16/19 0246  NA 134* 136 135 136  K 4.4 4.0 3.9 3.4*  CL 97* 100 105 107  CO2 27 21* 19* 19*  GLUCOSE 65* 158* 91 87  BUN 18 16 20 21   CREATININE 1.19 1.43* 1.48* 1.17  CALCIUM 9.2 9.0 7.5* 7.7*  MG  --   --  0.7*  --    GFR: Estimated Creatinine Clearance: 55.9 mL/min (by C-G formula based on SCr of 1.17 mg/dL). Liver Function Tests: Recent Labs  Lab 12/12/19 1602 12/14/19 0725  AST 21 25  ALT 16 17  ALKPHOS 79 109  BILITOT 0.9 1.5*  PROT 6.6 6.7  ALBUMIN 4.0 4.0   Recent Labs  Lab 12/14/19 0725  LIPASE 39   No results for input(s): AMMONIA in the last 168 hours. Coagulation Profile: Recent Labs  Lab 12/14/19 0725 12/15/19 0435  INR 1.2 1.4*   Cardiac Enzymes: No results for input(s): CKTOTAL, CKMB, CKMBINDEX, TROPONINI in the last 168 hours. BNP (last 3 results) No results for input(s): PROBNP in the last  8760 hours. HbA1C: Recent Labs    12/15/19 1116  HGBA1C 6.6*   CBG: Recent Labs  Lab 12/15/19 1240 12/15/19 1800 12/16/19 0031 12/16/19 0639  GLUCAP 80 78 81 81   Lipid Profile: No results for input(s): CHOL, HDL, LDLCALC, TRIG, CHOLHDL, LDLDIRECT in the last 72 hours. Thyroid Function Tests: No results for input(s): TSH, T4TOTAL, FREET4, T3FREE, THYROIDAB in the last 72 hours. Anemia Panel: No results for input(s): VITAMINB12, FOLATE, FERRITIN, TIBC, IRON, RETICCTPCT in the last 72 hours. Sepsis Labs: Recent Labs  Lab 12/14/19 1638 12/14/19 2055 12/15/19 0030 12/15/19 0435  PROCALCITON  --   --   --  97.64  LATICACIDVEN 2.4* 3.3* 3.3* 2.7*    Recent Results (from the past 240 hour(s))  Urine Culture     Status: None   Collection Time: 12/12/19  3:36 PM   Specimen: Urine, Random  Result Value Ref Range Status   Specimen Description   Final    URINE, RANDOM Performed at [facility], [address] [place], [place]    Special Requests   Final    NONE Performed at [facility], [address], [place], [place]    Culture   Final    NO GROWTH Performed at [facility], [address], [place], [place]    Report Status 12/14/2019 FINAL  Final  Blood Culture (routine x 2)     Status: None (Preliminary result)   Collection Time: 12/14/19  7:25 AM   Specimen: BLOOD  Result Value Ref Range Status   Specimen Description   Final    BLOOD LEFT ANTECUBITAL Performed at [facility], [address], [place], [place]  Special Requests   Final    BOTTLES DRAWN AEROBIC ONLY Blood Culture adequate volume Performed at [facility], [address], [place], [place] [address]    Culture  Setup Time   Final    GRAM NEGATIVE RODS GRAM POSITIVE COCCI IN CLUSTERS AEROBIC BOTTLE ONLY CRITICAL RESULT CALLED TO, READ BACK BY AND VERIFIED WITH: Trixie Deis 2297 12/15/2019 Girtha Hake Performed at [facility], [address] [place], [place] [address]    Culture Romie Minus NEGATIVE RODS GRAM POSITIVE COCCI   Final   Report Status PENDING  Incomplete  Blood Culture (routine x 2)     Status: None (Preliminary result)   Collection Time: 12/14/19  7:25 AM   Specimen: BLOOD  Result Value Ref Range Status   Specimen Description   Final    BLOOD BLOOD RIGHT FOREARM Performed at [facility], [address] [place], [place] [address]    Special Requests   Final    BOTTLES DRAWN AEROBIC AND ANAEROBIC Blood Culture adequate volume Performed at [facility], [address] [place], [place] [address]    Culture  Setup Time   Final    GRAM NEGATIVE RODS CRITICAL VALUE NOTED.  VALUE IS CONSISTENT WITH PREVIOUSLY REPORTED AND CALLED VALUE. IN BOTH AEROBIC AND ANAEROBIC BOTTLES Performed at [facility], [address], [place], [place] [address]    Culture GRAM NEGATIVE RODS  Final   Report Status PENDING  Incomplete  Urine culture     Status: Abnormal   Collection Time: 12/14/19  7:25 AM   Specimen: In/Out Cath Urine  Result Value Ref Range Status   Specimen Description   Final    IN/OUT CATH URINE Performed at [facility], [address] [place], [place] [address]    Special Requests   Final    NONE Performed at [facility], [address] [place], [place] [address]    Culture >=100,000 COLONIES/mL PROTEUS MIRABILIS (A)  Final   Report Status 12/16/2019 FINAL  Final   Organism ID, Bacteria PROTEUS MIRABILIS (A)  Final      Susceptibility   Proteus mirabilis - MIC*    AMPICILLIN <=2 SENSITIVE Sensitive     CEFAZOLIN <=4 SENSITIVE Sensitive     CEFTRIAXONE <=1 SENSITIVE Sensitive     CIPROFLOXACIN <=0.25 SENSITIVE Sensitive     GENTAMICIN <=1 SENSITIVE Sensitive     IMIPENEM 1 SENSITIVE Sensitive     NITROFURANTOIN 128 RESISTANT  Resistant     TRIMETH/SULFA <=20 SENSITIVE Sensitive     AMPICILLIN/SULBACTAM <=2 SENSITIVE Sensitive     PIP/TAZO <=4 SENSITIVE Sensitive     * >=100,000 COLONIES/mL PROTEUS MIRABILIS  Blood Culture ID Panel (Reflexed)     Status: Abnormal   Collection Time: 12/14/19  7:25 AM  Result Value Ref Range Status   Enterococcus species NOT DETECTED NOT DETECTED Final   Listeria monocytogenes NOT DETECTED NOT DETECTED Final   Staphylococcus species DETECTED (A) NOT DETECTED Final    Comment: Methicillin (oxacillin) susceptible coagulase negative staphylococcus. Possible blood culture contaminant (unless isolated from more than one blood culture draw or clinical case suggests pathogenicity). No antibiotic treatment is indicated for blood  culture contaminants. CRITICAL RESULT CALLED TO, READ BACK BY AND VERIFIED WITH: J. GRIMSLEY,PHARMD 0518 12/15/2019 T. TYSOR    Staphylococcus aureus (BCID) NOT DETECTED NOT DETECTED Final   Methicillin resistance NOT DETECTED NOT DETECTED Final   Streptococcus species  NOT DETECTED NOT DETECTED Final   Streptococcus agalactiae NOT DETECTED NOT DETECTED Final   Streptococcus pneumoniae NOT DETECTED NOT DETECTED Final   Streptococcus pyogenes NOT DETECTED NOT DETECTED Final   Acinetobacter baumannii NOT DETECTED NOT DETECTED Final   Enterobacteriaceae species DETECTED (A) NOT DETECTED Final    Comment: Enterobacteriaceae represent a large family of gram-negative bacteria, not a single organism. CRITICAL RESULT CALLED TO, READ BACK BY AND VERIFIED WITH: J. GRIMSLEY,PHARMD 0518 12/15/2019 T. TYSOR    Enterobacter cloacae complex NOT DETECTED NOT DETECTED Final   Escherichia coli NOT DETECTED NOT DETECTED Final   Klebsiella oxytoca NOT DETECTED NOT DETECTED Final   Klebsiella pneumoniae NOT DETECTED NOT DETECTED Final   Proteus species DETECTED (A) NOT DETECTED Final    Comment: CRITICAL RESULT CALLED TO, READ BACK BY AND VERIFIED WITH: J. GRIMSLEY,PHARMD 0518  12/15/2019 T. TYSOR    Serratia marcescens NOT DETECTED NOT DETECTED Final   Carbapenem resistance NOT DETECTED NOT DETECTED Final   Haemophilus influenzae NOT DETECTED NOT DETECTED Final   Neisseria meningitidis NOT DETECTED NOT DETECTED Final   Pseudomonas aeruginosa NOT DETECTED NOT DETECTED Final   Candida albicans NOT DETECTED NOT DETECTED Final   Candida glabrata NOT DETECTED NOT DETECTED Final   Candida krusei NOT DETECTED NOT DETECTED Final   Candida parapsilosis NOT DETECTED NOT DETECTED Final   Candida tropicalis NOT DETECTED NOT DETECTED Final    Comment: Performed at [facility], [address], [place], [place] [address]  SARS Coronavirus 2 by RT PCR (hospital order, performed in [facility] hospital lab) Nasopharyngeal Nasopharyngeal Swab     Status: None   Collection Time: 12/14/19 11:02 AM   Specimen: Nasopharyngeal Swab  Result Value Ref Range Status   SARS Coronavirus 2 NEGATIVE NEGATIVE Final    Comment: (NOTE) SARS-CoV-2 target nucleic acids are NOT DETECTED. The SARS-CoV-2 RNA is generally detectable in upper and lower respiratory specimens during the acute phase of infection. The lowest concentration of SARS-CoV-2 viral copies this assay can detect is 250 copies / mL. A negative result does not preclude SARS-CoV-2 infection and should not be used as the sole basis for treatment or other patient management decisions.  A negative result may occur with improper specimen collection / handling, submission of specimen other than nasopharyngeal swab, presence of viral mutation(s) within the areas targeted by this assay, and inadequate number of viral copies (<250 copies / mL). A negative result must be combined with clinical observations, patient history, and epidemiological information. Fact Sheet for Patients:   BoilerBrush.com.cy Fact Sheet for Healthcare Providers: https://pope.com/ This test is not yet  approved or cleared  by the [place] FDA and has been authorized for detection and/or diagnosis of SARS-CoV-2 by FDA under an Emergency Use Authorization (EUA).  This EUA will remain in effect (meaning this test can be used) for the duration of the COVID-19 declaration under Section 564(b)(1) of the Act, 21 U.S.C. section 360bbb-3(b)(1), unless the authorization is terminated or revoked sooner. Performed at [facility], [address], [place], [place] [address]   MRSA PCR Screening     Status: None   Collection Time: 12/14/19  8:28 PM   Specimen: Nasal Mucosa; Nasopharyngeal  Result Value Ref Range Status   MRSA by PCR NEGATIVE NEGATIVE Final    Comment:        The GeneXpert MRSA Assay (FDA approved for NASAL specimens only), is one component of a comprehensive MRSA colonization surveillance program. It  is not intended to diagnose MRSA infection nor to guide or monitor treatment for MRSA infections. Performed at [facility], [address], [place], [place] [address]       Radiology Studies: CT Abdomen Pelvis W Contrast  Result Date: 12/14/2019 CLINICAL DATA:  Nausea vomiting. EXAM: CT ABDOMEN AND PELVIS WITH CONTRAST TECHNIQUE: Multidetector CT imaging of the abdomen and pelvis was performed using the standard protocol following bolus administration of intravenous contrast. CONTRAST:  OMNIPAQUE IOHEXOL 300 MG/ML  SOLN COMPARISON:  None. FINDINGS: Lower chest: Enlarged heart. Calcific atherosclerotic disease of the coronary arteries and aorta. Small hiatal hernia. Hepatobiliary: Normal appearance of the liver. The gallbladder is normally distended. No radiopaque gallstones are seen. However there is a small amount of pericholecystic fluid. The common bile duct is not dilated. Pancreas: Unremarkable. No pancreatic ductal dilatation or surrounding inflammatory changes. Spleen: Normal in size without focal abnormality. Adrenals/Urinary  Tract: Normal adrenal glands. No evidence of hydronephrosis or nephrolithiasis. Bilateral perirenal fat stranding. Normal ureters and urinary bladder. Stomach/Bowel: Stomach is within normal limits. Appendix appears normal. No evidence of bowel wall distention, or inflammatory changes. Masslike thickening at the ileocecal junction may represent collapsed bowel, however true soft tissue mass cannot be excluded. Vascular/Lymphatic: Aortic atherosclerosis. No enlarged abdominal or pelvic lymph nodes. Reproductive: Slight enlargement of the prostate gland. Other: No abdominal wall hernia or abnormality. No abdominopelvic ascites. Musculoskeletal: Spondylosis of the lumbosacral spine. IMPRESSION: 1. No radiopaque gallstones or biliary ductal dilation is seen, however there is a small amount of pericholecystic fluid, which may be seen in early acute cholecystitis. 2. Bilateral perirenal fat stranding, nonspecific. Please correlate to urinalysis. 3. Masslike thickening at the ileocecal junction may represent collapsed bowel, however true soft tissue mass cannot be excluded. Please correlate to colonoscopy when clinically feasible. 4. Slight enlargement of the prostate gland. 5. Small hiatal hernia. Aortic Atherosclerosis (ICD10-I70.0). Electronically Signed   By: Ted Mcalpine M.D.   On: 12/14/2019 10:44   US Abdomen Limited RUQ  Result Date: 12/15/2019 CLINICAL DATA:  Pericholecystic fluid on CT. EXAM: ULTRASOUND ABDOMEN LIMITED RIGHT UPPER QUADRANT COMPARISON:  CT abdomen pelvis from yesterday. FINDINGS: Gallbladder: No gallstones or wall thickening visualized. No pericholecystic fluid. No sonographic Murphy sign noted by sonographer. Common bile duct: Diameter: 3 mm, normal. Liver: No focal lesion identified. Within normal limits in parenchymal echogenicity. Portal vein is patent on color Doppler imaging with normal direction of blood flow towards the liver. Other: None. IMPRESSION: 1. Normal right upper  quadrant ultrasound. Pericholecystic fluid has resolved. Electronically Signed   By: Obie Dredge M.D.   On: 12/15/2019 12:48     LOS: 2 days   Lanae Boast, MD [facility]  12/16/2019, 8:07 AM

## 2019-12-17 ENCOUNTER — Encounter (HOSPITAL_COMMUNITY): Payer: Self-pay | Admitting: Internal Medicine

## 2019-12-17 ENCOUNTER — Inpatient Hospital Stay (HOSPITAL_COMMUNITY): Payer: Medicare Other

## 2019-12-17 DIAGNOSIS — J9601 Acute respiratory failure with hypoxia: Secondary | ICD-10-CM

## 2019-12-17 DIAGNOSIS — Z515 Encounter for palliative care: Secondary | ICD-10-CM

## 2019-12-17 DIAGNOSIS — F329 Major depressive disorder, single episode, unspecified: Secondary | ICD-10-CM

## 2019-12-17 DIAGNOSIS — I10 Essential (primary) hypertension: Secondary | ICD-10-CM

## 2019-12-17 DIAGNOSIS — A419 Sepsis, unspecified organism: Secondary | ICD-10-CM

## 2019-12-17 DIAGNOSIS — B964 Proteus (mirabilis) (morganii) as the cause of diseases classified elsewhere: Secondary | ICD-10-CM

## 2019-12-17 DIAGNOSIS — Z789 Other specified health status: Secondary | ICD-10-CM

## 2019-12-17 DIAGNOSIS — G9341 Metabolic encephalopathy: Secondary | ICD-10-CM

## 2019-12-17 DIAGNOSIS — D72829 Elevated white blood cell count, unspecified: Secondary | ICD-10-CM

## 2019-12-17 DIAGNOSIS — D696 Thrombocytopenia, unspecified: Secondary | ICD-10-CM

## 2019-12-17 DIAGNOSIS — Z7189 Other specified counseling: Secondary | ICD-10-CM

## 2019-12-17 DIAGNOSIS — R6520 Severe sepsis without septic shock: Secondary | ICD-10-CM

## 2019-12-17 DIAGNOSIS — R531 Weakness: Secondary | ICD-10-CM

## 2019-12-17 DIAGNOSIS — Z7409 Other reduced mobility: Secondary | ICD-10-CM

## 2019-12-17 DIAGNOSIS — F419 Anxiety disorder, unspecified: Secondary | ICD-10-CM

## 2019-12-17 LAB — GLUCOSE, CAPILLARY
Glucose-Capillary: 143 mg/dL — ABNORMAL HIGH (ref 70–99)
Glucose-Capillary: 134 mg/dL — ABNORMAL HIGH (ref 70–99)
Glucose-Capillary: 144 mg/dL — ABNORMAL HIGH (ref 70–99)
Glucose-Capillary: 129 mg/dL — ABNORMAL HIGH (ref 70–99)

## 2019-12-17 LAB — BASIC METABOLIC PANEL
Anion gap: 9 (ref 5–15)
BUN: 27 mg/dL — ABNORMAL HIGH (ref 8–23)
CO2: 24 mmol/L (ref 22–32)
Calcium: 7.9 mg/dL — ABNORMAL LOW (ref 8.9–10.3)
Chloride: 102 mmol/L (ref 98–111)
Creatinine, Ser: 1.1 mg/dL (ref 0.61–1.24)
GFR calc Af Amer: >60 mL/min (ref >60)
GFR calc non Af Amer: >60 mL/min (ref >60)
Glucose, Bld: 159 mg/dL — ABNORMAL HIGH (ref 70–99)
Potassium: 3.5 mmol/L (ref 3.5–5.1)
Sodium: 135 mmol/L (ref 135–145)

## 2019-12-17 LAB — PROCALCITONIN: Procalcitonin: 27.8 ng/mL

## 2019-12-17 LAB — CULTURE, BLOOD (ROUTINE X 2)
Special Requests: ADEQUATE
Special Requests: ADEQUATE

## 2019-12-17 LAB — CBC
HCT: 37.9 % — ABNORMAL LOW (ref 39.0–52.0)
Hemoglobin: 12.5 g/dL — ABNORMAL LOW (ref 13.0–17.0)
MCH: 28.8 pg (ref 26.0–34.0)
MCHC: 33 g/dL (ref 30.0–36.0)
MCV: 87.3 fL (ref 80.0–100.0)
Platelets: 40 10*3/uL — ABNORMAL LOW (ref 150–400)
RBC: 4.34 MIL/uL (ref 4.22–5.81)
RDW: 14.5 % (ref 11.5–15.5)
WBC: 10.7 10*3/uL — ABNORMAL HIGH (ref 4.0–10.5)
nRBC: 0 % (ref 0.0–0.2)

## 2019-12-17 LAB — DIC (DISSEMINATED INTRAVASCULAR COAGULATION)PANEL
D-Dimer, Quant: 6.85 ug/mL-FEU — ABNORMAL HIGH (ref 0.00–0.50)
Fibrinogen: 473 mg/dL (ref 210–475)
INR: 0.9 (ref 0.8–1.2)
Platelets: 36 10*3/uL — ABNORMAL LOW (ref 150–400)
Prothrombin Time: 12 seconds (ref 11.4–15.2)
Smear Review: NONE SEEN
aPTT: 38 seconds — ABNORMAL HIGH (ref 24–36)

## 2019-12-17 LAB — MAGNESIUM: Magnesium: 1.9 mg/dL (ref 1.7–2.4)

## 2019-12-17 LAB — BRAIN NATRIURETIC PEPTIDE: B Natriuretic Peptide: 390.4 pg/mL — ABNORMAL HIGH (ref 0.0–100.0)

## 2019-12-17 LAB — IMMATURE PLATELET FRACTION: Immature Platelet Fraction: 6.6 % (ref 1.2–8.6)

## 2019-12-17 LAB — SAVE SMEAR(SSMR), FOR PROVIDER SLIDE REVIEW

## 2019-12-17 MED ORDER — OLANZAPINE 10 MG IM SOLR
2.5000 mg | Freq: Every evening | INTRAMUSCULAR | Status: DC | PRN
  Administered 2019-12-18 – 2019-12-22 (×5): 2.5 mg via INTRAMUSCULAR
  Filled 2019-12-17 (×8): qty 10

## 2019-12-17 MED ORDER — LACTATED RINGERS IV SOLN: INTRAVENOUS | Status: DC

## 2019-12-17 MED ORDER — LACTATED RINGERS IV SOLN: INTRAVENOUS | Status: AC

## 2019-12-17 MED ORDER — FUROSEMIDE 10 MG/ML IJ SOLN
20.0000 mg | Freq: Once | INTRAMUSCULAR | Status: AC
  Administered 2019-12-17: 20 mg via INTRAVENOUS
  Filled 2019-12-17: qty 2

## 2019-12-17 NOTE — Evaluation (Signed)
Clinical/Bedside Swallow Evaluation Patient Details  Name: Berdell Hostetler MRN: [id number] Date of Birth: Feb 02, 1941  Today's Date: 12/17/2019 Time: SLP Start Time (ACUTE ONLY): 0945 SLP Stop Time (ACUTE ONLY): 1015 SLP Time Calculation (min) (ACUTE ONLY): 30 min  Past Medical History:  Past Medical History:  Diagnosis Date  . Dementia ([place])   . Gout   . Hyperlipidemia   . Hypertension    Past Surgical History: History reviewed. No pertinent surgical history. HPI:  79 yo male adm to [facility] with AMS, n/v, found to have cholecystitis- urosepsis.  Pt also with h/o dementia, HTN, HLD, gout, falls.  Swallow evaluation ordered but pt attention has waxed/waned during hospital coarse. He has frequently either been lethargic or with mild agitation.  CXR negative for acute findings initially - cxr 5/26 showed Unchanged right greater than left lung opacities which may reflect asymmetric edema or pneumonia.   Assessment / Plan / Recommendation Clinical Impression  Patient remains lethargic but would open eyes and participate hand over hand for oral care.  Removed copious amount of colored secretions using toothette, toothbrush and oral suction.  Provided ice chips after oral care.  Pt masticating ice chips adequately - albeit some delayed - without indication of aspiration.  However pt did report he felt like he was "getting choked" after ice chip consumption.  Encouraged pt to cough to clear and provided suctioning. suspect pt's pharynx has retained secretions as well.  Pt also accepted a few tsps of honey thick juice - with no indications of pharyngeal dysphagia however concern is present for possible pharyngeal retention of secretion.  Hopeful for pt to be able to consume po when mentation improves.  Recommend ice chips when fully alert and reeval next date am.  Informed pt to which he agreed. SLP Visit Diagnosis: Dysphagia, oropharyngeal phase (R13.12)    Aspiration Risk  Severe aspiration risk;Risk for  inadequate nutrition/hydration    Diet Recommendation NPO;Ice chips PRN after oral care        Other  Recommendations   n/a  Follow up Recommendations   n/a     Frequency and Duration min 1 x/week  1 week       Prognosis Prognosis for Safe Diet Advancement: Fair Barriers to Reach Goals: Cognitive deficits;Severity of deficits      Swallow Study   General Date of Onset: 12/17/19 HPI: 79 yo male adm to [facility] with AMS, n/v, found to have cholecystitis- urosepsis.  Pt also with h/o dementia, HTN, HLD, gout, falls.  Swallow evaluation ordered but pt attention has waxed/waned during hospital coarse. He has frequently either been lethargic or with mild agitation.  CXR negative for acute findings initially - cxr 5/26 showed Unchanged right greater than left lung opacities which may reflect asymmetric edema or pneumonia. Type of Study: Bedside Swallow Evaluation Diet Prior to this Study: NPO Temperature Spikes Noted: No Respiratory Status: Nasal cannula History of Recent Intubation: No Behavior/Cognition: Lethargic/Drowsy Oral Cavity Assessment: Dried secretions;Excessive secretions;Dry Oral Care Completed by SLP: Yes(significant amount of oral care provided assisting to remove copious amounts of viscous secretions adhered to hard/soft palate and tongue) Vision: Functional for self-feeding Self-Feeding Abilities: Able to feed self Patient Positioning: Upright in bed Baseline Vocal Quality: Low vocal intensity Volitional Cough: Weak Volitional Swallow: Able to elicit(after moisture provided)    Oral/Motor/Sensory Function Overall Oral Motor/Sensory Function: Generalized oral weakness   Ice Chips Ice chips: Impaired Presentation: Spoon Oral Phase Functional Implications: Prolonged oral transit Pharyngeal Phase Impairments: Cough - Delayed  Thin Liquid Thin Liquid: Not tested    Nectar Thick Nectar Thick Liquid: Not tested   Honey Thick Honey Thick Liquid: Impaired Presentation:  Spoon Oral Phase Functional Implications: Prolonged oral transit Pharyngeal Phase Impairments: Suspected delayed Swallow;Cough - Delayed   Puree Puree: Not tested Other Comments: pt declined to consume   Solid     Solid: Not tested      Chales Abrahams 12/17/2019,11:54 AM  Rolena Infante, MS [facility] SLP Acute Rehab Services Office [phone]

## 2019-12-17 NOTE — Progress Notes (Signed)
PROGRESS NOTE    Clifford Dennis  WJX:[id number] DOB: 12/24/1940 DOA: 12/14/2019 PCP: System, Pcp Not In   Chef Complaints: Fall and Fever  Brief Narrative: [name] with PMH HTN,HLD,dementia,gout from [facility] sent for recurrent fall.He was just in the ER on 5/21 after a fall as well, also striking his head. The CT of his head revealed moderate cerebral atrophy but no acute bleed.  CT of his C-spine also was negative for acute injuries.  X-ray of right knee also negative.  On 5/23 he stated that he hit his right arm but did not fall to the ground nor strike his head.  He is a difficult historian and most of HPI is obtained from chart review. On 12/12/2019 it was reported that he was having dizziness and weakness more so on the left side since Saturday of last week.  There is reported history of decreased oral intake.Urinalysis and lab work-up were relatively unremarkable and after imaging studies, he was back to his mental baseline and he was discharged back to his ALF. He did require a straight catheterization due to difficulty voiding however prior to being discharged back. In ED:he was very lethargic, there is report of some vomiting and diarrhea  with vague abdominal pain.He is also endorsing some irritation with voiding. He was febrile,101.8,tachycardic 142,tachypneic 22, and initial BP 162/78 however this slowly down trended to 109/65 , also became hypoxic on room air satting down into the 80s and was placed on 2 L [place].Notable labs include: Lactic acid 4.8,BUN 16,creatinine 1.43. WBC 3.2,94% neutrophils.UA, large Hgb, 100 protein,positive nitrites, moderate leukocyte esterase, greater than 50 WBC. In the ER:he was given 3-4 L NS BS. Patient was admitted for severe sepsis/lactic acidosis/UTI. Blood culture and urine culture grew  Proteus. 5/25- respiratory distress, with uncontrolled hypertension and 200: 60 still bilateral opacities pulmonary edema versus multifocal pneumonia given 60 of IV  Lasix and steroids and had significant improvement with good urine output and seen by cardiology.  Subjective: Mumbling words, eyes closed " Leave me alone" Able to tell me his name. when asked about where he is states " I know where I am at, leave me alone" On 2 L nasal cannula had good UOP- 4200 mL WBC improved, PLT dropped further  Assessment & Plan:  Severe sepsis secondary to Proteus bacteremia and Proteus UTI : Proteus is pretty much pansensitive.  coag nega staph bacteremia- likley contamination. Patient initially hypotensive in 80s has received 6 L IV fluids on admission.lactic acid subsequently resolved.  Patient was in respiratory distress with hypoxia, Hypertension needing IV Lasix with improvement in her symptoms, continue gentle fluids while n.p.o.   Chest x-ray shows multifocal pneumonia versus edema - chganed to Zosyn 5/25 to cover aspiration/CAP WC count is downtrending, procalcitonin improving. Mild peri-cholecyttic fluid in the CT scan which is resolved on the right upper quadrant ultrasound.  Monitor closely.  Proteus UTI continue antibiotics as above.  Hopefully can de-escalate antibiotics soon  Hypoxic respiratory failure with acute respiratory distress/tachypnea tachycardia: Acute hypoxic respiratory failure with acute respiratory distress/?ARDS vs Multifocal Pneumonia,  Vs asymmetric acute pulmonary edema/ acute systolic chf:he has been on 2l O2 on admission as he was hypoxic in 80s: o2 need went up overnight 5.24-25,was wheezing too.Bp high in 200s, tachypneic in low 30s- s.p lasix 60 mg x1, iv steroidx1-with significant improvement.  Will give gentle low-dose Lasix today as chest x-ray shows unchanged right greater than left lung opacities which reflect asymmetric edema or pneumonia.  Continue on empiric Zosyn-with hope to de-escalate soon.  BNP slightly elevated and seen by cardiology, suspecting component of pulmonary edema.  Echocardiogram was obtained overall stable.   He had good urine output.  Elevated troponin: Flat suspect demand mismatch from hypoxic respiratory failure.  Seen by cardiology.    Hypomagnesemia- resolved  NSVT- repleted mag, k  Lactic acidosis/metabolic acidosis.  Has resolved.  GNF:AOZHYQ hemodynamics/volume mediated 2/2 Sepsis/volume depletion- resolved.  Creatinine improved while in n.p.o. keep on gentle IV fluids.  Hold Home  lisinopril Metformin on Hold. Recent Labs  Lab 12/14/19 0725 12/15/19 0435 12/16/19 0246 12/16/19 0715 12/17/19 0222  BUN 27*  CREATININE 1.43* 1.48* 1.17 1.09 1.10   Poorly controlled hypertension: Lisinopril on hold due to AKI and NPO.  Continue labetalol as needed.    Type 2 diabetes mellitus on Metformin at home remains on hold.  A1c stable 6.6.  Blood sugar stable and monitor on Sliding scale insulin while patient got steroid Recent Labs  Lab 12/16/19 0639 12/16/19 1231 12/16/19 1738 12/16/19 2320 12/17/19 0538  GLUCAP 81 123* 154* 137* 143*   Coagulopathy INR 1.4.Likely from sepsis.  Monitor. Recent Labs  Lab 12/14/19 0725 12/15/19 0435  INR 1.2 1.4*   Thrombocytopenia: Plat dropping, suspect from sepsis. Avoid heparin. Hb normal- no obvious bleeding. check LFT/Bili/HIT panel DIC panel and oncology is consulted on board Recent Labs  Lab 12/12/19 1602 12/14/19 0725 12/15/19 0435 12/16/19 0246 12/17/19 0222  PLT 228 246 144* 90* 40*   Acute metabolic encephalopathy with the setting of dementia anxiety depression: Confused likely from sepsis. At baseline does not have good short-term memory with dementia per son.  Appears alert awake answering some questions but not sure if he is back to baseline yet.  Continue supportive care once abel to take po resume  bupropion/Celexa.  Slowly resume home Xanax, Neurontin.  Impaired mobility and ADLs:Moved to ALF 3 wks ago and has declined rapidly as per son.He stopped eating since moving, PT/OT eval. patient has had frequent falls  at ALF.Consult dietitian.  Hyperlipidemia: once able to take po resume statin  GOC: DNR.Son [name] father is trying to shut down" his wife and son passed away past years.  I have explained discussed patient with patient's sons previously, palliative care has been consulted.  Son is appreciative of the consult.   Transfer to telemetry.  DVT prophylaxis:SCD. Heparin SQ on hold for thrombocytopenia. Code Status: DNR Family Communication: plan of care discussed with patient at bedside.  I had updated patient's son over the phone extensively.  We will update the son, nursing has spoken this morning with him.  Status is: Inpatient Remains inpatient appropriate because:Altered mental status, IV treatments appropriate due to intensity of illness or inability to take PO, Inpatient level of care appropriate due to severity of illness and For ongoing treatment of sepsis, bacteremia  Dispo: The patient is from: Home              Anticipated d/c is to: TBD              Anticipated d/c date is: 3 days              Patient currently is not medically stable to d/c.  Nutrition- NPO until SLP eval Diet Order            Diet NPO time specified  Diet effective now              Body  mass index is 31.32 kg/m.  Consultants:see note  Procedures:  CT Abdomen 1.No radiopaque gallstones or biliary ductal dilation is seen, however there is a small amount of pericholecystic fluid, which may be seen in early acute cholecystitis. 2. Bilateral perirenal fat stranding, nonspecific. Please correlate to urinalysis. 3. Masslike thickening at the ileocecal junction may represent collapsed bowel, however true soft tissue mass cannot be excluded. Please correlate to colonoscopy when clinically feasible. 4. Slight enlargement of the prostate gland. 5. Small hiatal hernia.  TTE 1. Left ventricular ejection fraction, by estimation, is 60 to 65%. The  left ventricle has normal function. The left ventricle has  no regional  wall motion abnormalities. Left ventricular diastolic parameters were  normal.  2. Right ventricular systolic function is normal. The right ventricular  size is normal. There is mildly elevated pulmonary artery systolic  pressure.  3. The mitral valve is normal in structure. Mild mitral valve  regurgitation. No evidence of mitral stenosis.  4. Node of Arentius (normal variant) noted on left coronary cusp). The  aortic valve is normal in structure. Aortic valve regurgitation is not  visualized. No aortic stenosis is present.   Microbiology:see note  Medications: Scheduled Meds: . allopurinol  300 mg Oral Daily  . aspirin EC  81 mg Oral Daily  . atorvastatin  20 mg Oral QPM  . buPROPion  100 mg Oral Daily  . Chlorhexidine Gluconate Cloth  6 each Topical Daily  . escitalopram  10 mg Oral Daily  . heparin  5,000 Units Subcutaneous Q8H  . insulin aspart  0-6 Units Subcutaneous Q6H  . pantoprazole (PROTONIX) IV  40 mg Intravenous Q24H  . sodium chloride flush  3 mL Intravenous Q12H  . valACYclovir  500 mg Oral Daily   Continuous Infusions: . lactated ringers Stopped (12/17/19 0349)  . piperacillin-tazobactam (ZOSYN)  IV 12.5 mL/hr at 12/17/19 0600    Antimicrobials: Anti-infectives (From admission, onward)   Start     Dose/Rate Route Frequency Ordered Stop   12/16/19 1200  piperacillin-tazobactam (ZOSYN) IVPB 3.375 g     3.375 g 12.5 mL/hr over 240 Minutes Intravenous Every 8 hours 12/16/19 1035     12/15/19 1300  vancomycin (VANCOCIN) IVPB 1000 mg/200 mL premix  Status:  Discontinued     1,000 mg 200 mL/hr over 60 Minutes Intravenous Every 24 hours 12/14/19 1237 12/15/19 0526   12/15/19 0600  cefTRIAXone (ROCEPHIN) 2 g in sodium chloride 0.9 % 100 mL IVPB  Status:  Discontinued     2 g 200 mL/hr over 30 Minutes Intravenous Every 24 hours 12/15/19 0527 12/16/19 1035   12/14/19 2000  ceFEPIme (MAXIPIME) 2 g in sodium chloride 0.9 % 100 mL IVPB  Status:   Discontinued     2 g 200 mL/hr over 30 Minutes Intravenous Every 12 hours 12/14/19 1210 12/15/19 0526   12/14/19 1600  metroNIDAZOLE (FLAGYL) IVPB 500 mg  Status:  Discontinued     500 mg 100 mL/hr over 60 Minutes Intravenous Every 8 hours 12/14/19 1210 12/15/19 0526   12/14/19 1415  valACYclovir (VALTREX) tablet 500 mg     500 mg Oral Daily 12/14/19 1412     12/14/19 1230  vancomycin (VANCOREADY) IVPB 2000 mg/400 mL     2,000 mg 200 mL/hr over 120 Minutes Intravenous  Once 12/14/19 1223 12/14/19 1655   12/14/19 0830  ceFEPIme (MAXIPIME) 2 g in sodium chloride 0.9 % 100 mL IVPB     2 g 200 mL/hr over 30 Minutes  Intravenous  Once 12/14/19 0815 12/14/19 1117   12/14/19 0830  metroNIDAZOLE (FLAGYL) IVPB 500 mg     500 mg 100 mL/hr over 60 Minutes Intravenous  Once 12/14/19 0815 12/14/19 1117    Objective: Vitals: Today's Vitals   12/17/19 0400 12/17/19 0418 12/17/19 0500 12/17/19 0600  BP: 134/84  (!) 161/76 (!) 146/68  Pulse: 74  74 74  Resp: 17  16 18   Temp:  97.7 F (36.5 C)    TempSrc:  Axillary    SpO2: 100%  100% 97%  Weight:      Height:      PainSc:        Intake/Output Summary (Last 24 hours) at 12/17/2019 0723 Last data filed at 12/17/2019 0600 Gross per 24 hour  Intake 1208.74 ml  Output 4200 ml  Net -2991.26 ml   Filed Weights   12/14/19 0819  Weight: 90.7 kg   Weight change:    Intake/Output from previous day: 05/25 0701 - 05/26 0700 In: 1208.7 [I.V.:1082; IV Piggyback:126.8] Out: 4200 [Urine:4200] Intake/Output this shift: No intake/output data recorded.  Examination:  General exam: Morning eyes closed on clear nasal cannula, not in distress.  Comfortable.   HEENT:Oral mucosa moist, Ear/Nose WNL grossly, dentition normal. Respiratory system: bilaterally clear no wheezing today,no use of accessory muscle Cardiovascular system: S1 & S2 +, No JVD,. Gastrointestinal system: Abdomen soft, NT,ND, BS+ Nervous System: Eyes closed moving extremities,  follows some commands,  Extremities: No edema, distal peripheral pulses palpable.  Skin: No rashes,no icterus. MSK: Normal muscle bulk,tone, power  Data Reviewed: I have personally reviewed following labs and imaging studies CBC: Recent Labs  Lab 12/12/19 1602 12/14/19 0725 12/15/19 0435 12/16/19 0246 12/17/19 0222  WBC 9.1 3.2* 18.6* 14.7* 10.7*  NEUTROABS 7.1 2.9 17.1*  --   --   HGB 12.8* 13.7 11.8* 11.9* 12.5*  HCT 39.3 42.8 36.4* 35.4* 37.9*  MCV 86.8 87.9 88.8 85.5 87.3  PLT 228 246 144* 90* 40*   Basic Metabolic Panel: Recent Labs  Lab 12/14/19 0725 12/15/19 0435 12/16/19 0246 12/16/19 0715 12/17/19 0222  NA 136 135 136 136 135  K 4.0 3.9 3.4* 3.6 3.5  CL 100 105 107 105 102  CO2 21* 19* 19* 18* 24  GLUCOSE 158* 91 87 92 159*  BUN 16 20 21 20  27*  CREATININE 1.43* 1.48* 1.17 1.09 1.10  CALCIUM 9.0 7.5* 7.7* 7.6* 7.9*  MG  --  0.7*  --  1.3* 1.9   GFR: Estimated Creatinine Clearance: 59.4 mL/min (by C-G formula based on SCr of 1.1 mg/dL). Liver Function Tests: Recent Labs  Lab 12/12/19 1602 12/14/19 0725  AST 21 25  ALT 16 17  ALKPHOS 79 109  BILITOT 0.9 1.5*  PROT 6.6 6.7  ALBUMIN 4.0 4.0   Recent Labs  Lab 12/14/19 0725  LIPASE 39   No results for input(s): AMMONIA in the last 168 hours. Coagulation Profile: Recent Labs  Lab 12/14/19 0725 12/15/19 0435  INR 1.2 1.4*   Cardiac Enzymes: No results for input(s): CKTOTAL, CKMB, CKMBINDEX, TROPONINI in the last 168 hours. BNP (last 3 results) No results for input(s): PROBNP in the last 8760 hours. HbA1C: Recent Labs    12/15/19 1116  HGBA1C 6.6*   CBG: Recent Labs  Lab 12/16/19 0639 12/16/19 1231 12/16/19 1738 12/16/19 2320 12/17/19 0538  GLUCAP 81 123* 154* 137* 143*   Lipid Profile: No results for input(s): CHOL, HDL, LDLCALC, TRIG, CHOLHDL, LDLDIRECT in the last 72 hours.  Thyroid Function Tests: No results for input(s): TSH, T4TOTAL, FREET4, T3FREE, THYROIDAB in the last  72 hours. Anemia Panel: No results for input(s): VITAMINB12, FOLATE, FERRITIN, TIBC, IRON, RETICCTPCT in the last 72 hours. Sepsis Labs: Recent Labs  Lab 12/14/19 2055 12/15/19 0030 12/15/19 0435 12/16/19 0850 12/17/19 0222  PROCALCITON  --   --  97.64  --  27.80  LATICACIDVEN 3.3* 3.3* 2.7* 1.8  --     Recent Results (from the past 240 hour(s))  Urine Culture     Status: None   Collection Time: 12/12/19  3:36 PM   Specimen: Urine, Random  Result Value Ref Range Status   Specimen Description   Final    URINE, RANDOM Performed at [facility], [address], [place], [place] [address]    Special Requests   Final    NONE Performed at [facility], [address] [place], [place] [address]    Culture   Final    NO GROWTH Performed at [facility], [address], [place], [place] [address]    Report Status 12/14/2019 FINAL  Final  Blood Culture (routine x 2)     Status: Abnormal (Preliminary result)   Collection Time: 12/14/19  7:25 AM   Specimen: BLOOD  Result Value Ref Range Status   Specimen Description   Final    BLOOD LEFT ANTECUBITAL Performed at [facility], [address], [place], [place] [address]    Special Requests   Final    BOTTLES DRAWN AEROBIC ONLY Blood Culture adequate volume Performed at [facility], [address], [place], [place] [address]    Culture  Setup Time   Final    GRAM NEGATIVE RODS GRAM POSITIVE COCCI IN CLUSTERS AEROBIC BOTTLE ONLY CRITICAL RESULT CALLED TO, READ BACK BY AND VERIFIED WITH: Trixie Deis [id number] 12/15/2019 Girtha Hake Performed at [facility], [address], [place], [place] [address]    Culture PROTEUS MIRABILIS GRAM POSITIVE COCCI  (A)  Final   Report Status PENDING  Incomplete  Blood Culture (routine x 2)     Status: Abnormal (Preliminary result)   Collection Time: 12/14/19  7:25 AM   Specimen: BLOOD  Result Value Ref  Range Status   Specimen Description   Final    BLOOD BLOOD RIGHT FOREARM Performed at [facility], [address] [place], [place] [address]    Special Requests   Final    BOTTLES DRAWN AEROBIC AND ANAEROBIC Blood Culture adequate volume Performed at [facility], [address] [place], [place] [address]    Culture  Setup Time   Final    GRAM NEGATIVE RODS CRITICAL VALUE NOTED.  VALUE IS CONSISTENT WITH PREVIOUSLY REPORTED AND CALLED VALUE. IN BOTH AEROBIC AND ANAEROBIC BOTTLES Performed at [facility], [address], [place], [place] [address]    Culture PROTEUS MIRABILIS (A)  Final   Report Status PENDING  Incomplete  Urine culture     Status: Abnormal   Collection Time: 12/14/19  7:25 AM   Specimen: In/Out Cath Urine  Result Value Ref Range Status   Specimen Description   Final    IN/OUT CATH URINE Performed at [facility], [address], [place], [place] [address]    Special Requests   Final    NONE Performed at [facility], [address], [place], [place] [address]    Culture >=100,000  COLONIES/mL PROTEUS MIRABILIS (A)  Final   Report Status 12/16/2019 FINAL  Final   Organism ID, Bacteria PROTEUS MIRABILIS (A)  Final      Susceptibility   Proteus mirabilis - MIC*    AMPICILLIN <=2 SENSITIVE Sensitive     CEFAZOLIN <=4 SENSITIVE Sensitive     CEFTRIAXONE <=1 SENSITIVE Sensitive     CIPROFLOXACIN <=0.25 SENSITIVE Sensitive     GENTAMICIN <=1 SENSITIVE Sensitive     IMIPENEM 1 SENSITIVE Sensitive     NITROFURANTOIN 128 RESISTANT Resistant     TRIMETH/SULFA <=20 SENSITIVE Sensitive     AMPICILLIN/SULBACTAM <=2 SENSITIVE Sensitive     PIP/TAZO <=4 SENSITIVE Sensitive     * >=100,000 COLONIES/mL PROTEUS MIRABILIS  Blood Culture ID Panel (Reflexed)     Status: Abnormal   Collection Time: 12/14/19  7:25 AM  Result Value Ref Range Status   Enterococcus species NOT DETECTED NOT  DETECTED Final   Listeria monocytogenes NOT DETECTED NOT DETECTED Final   Staphylococcus species DETECTED (A) NOT DETECTED Final    Comment: Methicillin (oxacillin) susceptible coagulase negative staphylococcus. Possible blood culture contaminant (unless isolated from more than one blood culture draw or clinical case suggests pathogenicity). No antibiotic treatment is indicated for blood  culture contaminants. CRITICAL RESULT CALLED TO, READ BACK BY AND VERIFIED WITH: J. GRIMSLEY,PHARMD 0518 12/15/2019 T. TYSOR    Staphylococcus aureus (BCID) NOT DETECTED NOT DETECTED Final   Methicillin resistance NOT DETECTED NOT DETECTED Final   Streptococcus species NOT DETECTED NOT DETECTED Final   Streptococcus agalactiae NOT DETECTED NOT DETECTED Final   Streptococcus pneumoniae NOT DETECTED NOT DETECTED Final   Streptococcus pyogenes NOT DETECTED NOT DETECTED Final   Acinetobacter baumannii NOT DETECTED NOT DETECTED Final   Enterobacteriaceae species DETECTED (A) NOT DETECTED Final    Comment: Enterobacteriaceae represent a large family of gram-negative bacteria, not a single organism. CRITICAL RESULT CALLED TO, READ BACK BY AND VERIFIED WITH: J. GRIMSLEY,PHARMD 0518 12/15/2019 T. TYSOR    Enterobacter cloacae complex NOT DETECTED NOT DETECTED Final   Escherichia coli NOT DETECTED NOT DETECTED Final   Klebsiella oxytoca NOT DETECTED NOT DETECTED Final   Klebsiella pneumoniae NOT DETECTED NOT DETECTED Final   Proteus species DETECTED (A) NOT DETECTED Final    Comment: CRITICAL RESULT CALLED TO, READ BACK BY AND VERIFIED WITH: J. GRIMSLEY,PHARMD 0518 12/15/2019 T. TYSOR    Serratia marcescens NOT DETECTED NOT DETECTED Final   Carbapenem resistance NOT DETECTED NOT DETECTED Final   Haemophilus influenzae NOT DETECTED NOT DETECTED Final   Neisseria meningitidis NOT DETECTED NOT DETECTED Final   Pseudomonas aeruginosa NOT DETECTED NOT DETECTED Final   Candida albicans NOT DETECTED NOT DETECTED  Final   Candida glabrata NOT DETECTED NOT DETECTED Final   Candida krusei NOT DETECTED NOT DETECTED Final   Candida parapsilosis NOT DETECTED NOT DETECTED Final   Candida tropicalis NOT DETECTED NOT DETECTED Final    Comment: Performed at [facility], [address], [place], [place] [address]  SARS Coronavirus 2 by RT PCR (hospital order, performed in [facility] hospital lab) Nasopharyngeal Nasopharyngeal Swab     Status: None   Collection Time: 12/14/19 11:02 AM   Specimen: Nasopharyngeal Swab  Result Value Ref Range Status   SARS Coronavirus 2 NEGATIVE NEGATIVE Final    Comment: (NOTE) SARS-CoV-2 target nucleic acids are NOT DETECTED. The SARS-CoV-2 RNA is generally detectable in upper and lower respiratory specimens during the acute phase of infection. The lowest concentration of SARS-CoV-2 viral  copies this assay can detect is 250 copies / mL. A negative result does not preclude SARS-CoV-2 infection and should not be used as the sole basis for treatment or other patient management decisions.  A negative result may occur with improper specimen collection / handling, submission of specimen other than nasopharyngeal swab, presence of viral mutation(s) within the areas targeted by this assay, and inadequate number of viral copies (<250 copies / mL). A negative result must be combined with clinical observations, patient history, and epidemiological information. Fact Sheet for Patients:   BoilerBrush.com.cy Fact Sheet for Healthcare Providers: https://pope.com/ This test is not yet approved or cleared  by the [place] FDA and has been authorized for detection and/or diagnosis of SARS-CoV-2 by FDA under an Emergency Use Authorization (EUA).  This EUA will remain in effect (meaning this test can be used) for the duration of the COVID-19 declaration under Section 564(b)(1) of the Act, 21 U.S.C. section 360bbb-3(b)(1), unless the  authorization is terminated or revoked sooner. Performed at [facility], [address] [place], [place] [address]   MRSA PCR Screening     Status: None   Collection Time: 12/14/19  8:28 PM   Specimen: Nasal Mucosa; Nasopharyngeal  Result Value Ref Range Status   MRSA by PCR NEGATIVE NEGATIVE Final    Comment:        The GeneXpert MRSA Assay (FDA approved for NASAL specimens only), is one component of a comprehensive MRSA colonization surveillance program. It is not intended to diagnose MRSA infection nor to guide or monitor treatment for MRSA infections. Performed at [facility], [address], [place], [place] [address]       Radiology Studies: [facility] Chest Port 1 View  Result Date: 12/16/2019 CLINICAL DATA:  Urosepsis. Congestive heart failure. EXAM: PORTABLE CHEST 1 VIEW COMPARISON:  One-view chest x-ray 12/16/2019 8:01 a.m. FINDINGS: Heart size is upper limits of normal. Atherosclerotic calcifications are present in the aorta. Asymmetric interstitial and airspace disease is worse on the right. There is no significant interval change. Effusions are suspected. IMPRESSION: 1. No significant interval change. 2. Asymmetric interstitial and airspace disease is worse on the right, compatible with asymmetric edema or infection. 3. Probable small pleural effusions. Electronically Signed   By: Marin Roberts M.D.   On: 12/16/2019 13:28   DG Chest Port 1 View  Result Date: 12/16/2019 CLINICAL DATA:  Hypoxia. EXAM: PORTABLE CHEST 1 VIEW COMPARISON:  Dec 14, 2019. FINDINGS: Stable cardiomediastinal silhouette. No pneumothorax or pleural effusion is noted. New large bilateral lung opacities are noted, right greater than left, consistent with multifocal pneumonia or possibly edema. Bony thorax is unremarkable. IMPRESSION: New large bilateral lung opacities are noted, right greater than left, consistent with multifocal pneumonia or possibly edema.  Aortic Atherosclerosis (ICD10-I70.0). Electronically Signed   By: Lupita Raider M.D.   On: 12/16/2019 08:31   ECHOCARDIOGRAM COMPLETE  Result Date: 12/16/2019    ECHOCARDIOGRAM REPORT   Patient Name:   Aarian Morrish Date of Exam: 12/16/2019 Medical Rec #:  [id number]     Height:       67.0 in Accession #:    [id number]    Weight:       200.0 lb Date of Birth:  23-Nov-1940     BSA:          2.022 m Patient Age:    78 years      BP:           117/70  mmHg Patient Gender: M             HR:           87 bpm. Exam Location:  Inpatient Procedure: 2D Echo, Cardiac Doppler and Color Doppler Indications:     CHF  History:         Patient has no prior history of Echocardiogram examinations.                  Signs/Symptoms:Altered Mental Status; Risk                  Factors:Hypertension, Diabetes and Dyslipidemia. Dementia,                  sepsis.  Sonographer:     Lavenia Atlas Referring Phys:  [id number] Waverly Tarquinio Diagnosing Phys: Yates Decamp MD  Sonographer Comments: Altered mental status. IMPRESSIONS  1. Left ventricular ejection fraction, by estimation, is 60 to 65%. The left ventricle has normal function. The left ventricle has no regional wall motion abnormalities. Left ventricular diastolic parameters were normal.  2. Right ventricular systolic function is normal. The right ventricular size is normal. There is mildly elevated pulmonary artery systolic pressure.  3. The mitral valve is normal in structure. Mild mitral valve regurgitation. No evidence of mitral stenosis.  4. Node of Arentius (normal variant) noted on left coronary cusp). The aortic valve is normal in structure. Aortic valve regurgitation is not visualized. No aortic stenosis is present. FINDINGS  Left Ventricle: Left ventricular ejection fraction, by estimation, is 60 to 65%. The left ventricle has normal function. The left ventricle has no regional wall motion abnormalities. The left ventricular internal cavity size was normal in size. There is  no  left ventricular hypertrophy. Left ventricular diastolic parameters were normal. Right Ventricle: The right ventricular size is normal. No increase in right ventricular wall thickness. Right ventricular systolic function is normal. There is mildly elevated pulmonary artery systolic pressure. The tricuspid regurgitant velocity is 3.12  m/s, and with an assumed right atrial pressure of 3 mmHg, the estimated right ventricular systolic pressure is 41.9 mmHg. Left Atrium: Left atrial size was normal in size. Right Atrium: Right atrial size was normal in size. Pericardium: There is no evidence of pericardial effusion. Mitral Valve: The mitral valve is normal in structure. Normal mobility of the mitral valve leaflets. Mild mitral valve regurgitation. No evidence of mitral valve stenosis. Tricuspid Valve: The tricuspid valve is normal in structure. Tricuspid valve regurgitation is trivial. No evidence of tricuspid stenosis. Aortic Valve: Node of Arentius (normal variant) noted on left coronary cusp). The aortic valve is normal in structure. Aortic valve regurgitation is not visualized. Aortic regurgitation PHT measures 433 msec. No aortic stenosis is present. Pulmonic Valve: The pulmonic valve was normal in structure. Pulmonic valve regurgitation is trivial. No evidence of pulmonic stenosis. Aorta: The aortic root is normal in size and structure. IAS/Shunts: No atrial level shunt detected by color flow Doppler.  LEFT VENTRICLE PLAX 2D LVIDd:         4.50 cm  Diastology LVIDs:         3.40 cm  LV e' lateral:   8.49 cm/s LV PW:         1.00 cm  LV E/e' lateral: 8.7 LV IVS:        0.90 cm  LV e' medial:    9.25 cm/s LVOT diam:     2.20 cm  LV E/e' medial:  8.0 LV SV:  59 LV SV Index:   29 LVOT Area:     3.80 cm  RIGHT VENTRICLE RV Basal diam:  2.40 cm RV S prime:     18.50 cm/s TAPSE (M-mode): 2.9 cm LEFT ATRIUM             Index       RIGHT ATRIUM           Index LA diam:        3.60 cm 1.78 cm/m  RA Area:     17.30  cm LA Vol (A2C):   46.9 ml 23.19 ml/m RA Volume:   44.40 ml  21.96 ml/m LA Vol (A4C):   49.5 ml 24.48 ml/m LA Biplane Vol: 50.2 ml 24.82 ml/m  AORTIC VALVE LVOT Vmax:   92.70 cm/s LVOT Vmean:  66.000 cm/s LVOT VTI:    0.155 m AI PHT:      433 msec  AORTA Ao Root diam: 3.40 cm MITRAL VALVE               TRICUSPID VALVE MV Area (PHT): 3.99 cm    TR Peak grad:   38.9 mmHg MV Decel Time: 190 msec    TR Vmax:        312.00 cm/s MV E velocity: 74.10 cm/s MV A velocity: 81.00 cm/s  SHUNTS MV E/A ratio:  0.91        Systemic VTI:  0.16 m                            Systemic Diam: 2.20 cm Yates Decamp MD Electronically signed by Yates Decamp MD Signature Date/Time: 12/16/2019/12:30:31 PM    Final    US Abdomen Limited RUQ  Result Date: 12/15/2019 CLINICAL DATA:  Pericholecystic fluid on CT. EXAM: ULTRASOUND ABDOMEN LIMITED RIGHT UPPER QUADRANT COMPARISON:  CT abdomen pelvis from yesterday. FINDINGS: Gallbladder: No gallstones or wall thickening visualized. No pericholecystic fluid. No sonographic Murphy sign noted by sonographer. Common bile duct: Diameter: 3 mm, normal. Liver: No focal lesion identified. Within normal limits in parenchymal echogenicity. Portal vein is patent on color Doppler imaging with normal direction of blood flow towards the liver. Other: None. IMPRESSION: 1. Normal right upper quadrant ultrasound. Pericholecystic fluid has resolved. Electronically Signed   By: Obie Dredge M.D.   On: 12/15/2019 12:48     LOS: 3 days   Lanae Boast, MD [facility]  12/17/2019, 7:23 AM

## 2019-12-17 NOTE — Consult Note (Signed)
Consultation Note Date: 12/17/2019   Patient Name: Clifford Dennis  DOB: 10/17/1940  MRN: [id number]  Age / Sex: 79 y.o., male  PCP: System, Pcp Not In Referring Physician: Lanae Boast, MD  Reason for Consultation: Establishing goals of care  HPI/Patient Profile: 79 y.o. male  admitted on 12/14/2019    Mr. Couzens is a 79 year old male with a past medical history significant for hypertension, hyperlipidemia, dementia, and gout.  The patient currently resides at [facility] assisted living and was brought to the emergency room after recurrent fall.  He was recently in the emergency room on 12/12/2019 following a fall as well, also striking his head.  Clinical Assessment and Goals of Care: Patient admitted [facility] for severe sepsis, urosepsis proteus bacteremia. He has acute hypoxic resp failure, possibly ARDS versus multi focal PNA. He has DM HLD HTN. Hospital course complicated by AKI, dysphagia, altered mental status.   A palliative consult has been requested for goals of care discussions.   Patient is resting in bed. He talks with his eyes half closed. He answers questions appropriately, how ever is not oriented to place/time. No family at bedside at time of my visit.   Palliative medicine is specialized medical care for people living with serious illness. It focuses on providing relief from the symptoms and stress of a serious illness. The goal is to improve quality of life for both the patient and the family.  Goals of care: Broad aims of medical therapy in relation to the patient's values and preferences. Our aim is to provide medical care aimed at enabling patients to achieve the goals that matter most to them, given the circumstances of their particular medical situation and their constraints.   Discussed with patient, discussed with bedside staff. recommend continuation of current therapies,  monitoring hospital course and overall disease trajectory. See below. Thank you for the consult.    HCPOA  son.   SUMMARY OF RECOMMENDATIONS    Agree with DNR Continue current mode of treatment.  Appreciate SLP recommendations, monitor for improvement in mental status and if the patient will be able to take PO soon.  Recommend palliative care following the patient at his ALF on discharge.  Thank you for the consult.   Code Status/Advance Care Planning:  DNR    Symptom Management:   As above.    Palliative Prophylaxis:   Delirium Protocol  Additional Recommendations (Limitations, Scope, Preferences):    Psycho-social/Spiritual:   Desire for further Chaplaincy support:yes  Additional Recommendations: Caregiving  Support/Resources  Prognosis:   Unable to determine  Discharge Planning: recommend ALF with palliative services following.       Primary Diagnoses: Present on Admission: . Severe sepsis (HCC)   I have reviewed the medical record, interviewed the patient and family, and examined the patient. The following aspects are pertinent.  Past Medical History:  Diagnosis Date  . Dementia (HCC)   . Gout   . Hyperlipidemia   . Hypertension    Social History   Socioeconomic History  .  Marital status: Widowed    Spouse name: Not on file  . Number of children: Not on file  . Years of education: Not on file  . Highest education level: Not on file  Occupational History  . Not on file  Tobacco Use  . Smoking status: Never Smoker  . Smokeless tobacco: Never Used  Substance and Sexual Activity  . Alcohol use: Not on file  . Drug use: Not on file  . Sexual activity: Not on file  Other Topics Concern  . Not on file  Social History Narrative  . Not on file   Social Determinants of Health   Financial Resource Strain:   . Difficulty of Paying Living Expenses:   Food Insecurity:   . Worried About Charity fundraiser in the Last Year:   . Academic librarian in the Last Year:   Transportation Needs:   . Film/video editor (Medical):   Marland Kitchen Lack of Transportation (Non-Medical):   Physical Activity:   . Days of Exercise per Week:   . Minutes of Exercise per Session:   Stress:   . Feeling of Stress :   Social Connections:   . Frequency of Communication with Friends and Family:   . Frequency of Social Gatherings with Friends and Family:   . Attends Religious Services:   . Active Member of Clubs or Organizations:   . Attends Archivist Meetings:   Marland Kitchen Marital Status:    History reviewed. No pertinent family history. Scheduled Meds: . allopurinol  300 mg Oral Daily  . aspirin EC  81 mg Oral Daily  . atorvastatin  20 mg Oral QPM  . buPROPion  100 mg Oral Daily  . Chlorhexidine Gluconate Cloth  6 each Topical Daily  . escitalopram  10 mg Oral Daily  . insulin aspart  0-6 Units Subcutaneous Q6H  . pantoprazole (PROTONIX) IV  40 mg Intravenous Q24H  . sodium chloride flush  3 mL Intravenous Q12H  . valACYclovir  500 mg Oral Daily   Continuous Infusions: . lactated ringers 30 mL/hr at 12/17/19 0800  . lactated ringers 50 mL/hr at 12/17/19 1607  . [START ON 12/18/2019] lactated ringers    . piperacillin-tazobactam (ZOSYN)  IV 3.375 g (12/17/19 1300)   PRN Meds:.acetaminophen **OR** acetaminophen, albuterol, hydrALAZINE, labetalol Medications Prior to Admission:  Prior to Admission medications   Medication Sig Start Date End Date Taking? Authorizing Provider  allopurinol (ZYLOPRIM) 300 MG tablet Take 300 mg by mouth daily.    [provider]  ALPRAZolam Duanne Moron) 0.5 MG tablet Take 0.5 mg by mouth 3 (three) times daily as needed for anxiety.    [provider]  aspirin EC 81 MG tablet Take 81 mg by mouth daily.    [provider]  atorvastatin (LIPITOR) 20 MG tablet Take 20 mg by mouth every evening.    [provider]  buPROPion (WELLBUTRIN SR) 100 MG 12 hr tablet Take 100 mg by mouth daily.     [provider]  escitalopram (LEXAPRO) 10 MG tablet Take 10 mg by mouth daily.    [provider]  gabapentin (NEURONTIN) 300 MG capsule Take 300 mg by mouth 2 (two) times daily.    [provider]  lisinopril (ZESTRIL) 5 MG tablet Take 5 mg by mouth daily.    [provider]  metFORMIN (GLUCOPHAGE) 500 MG tablet Take 500 mg by mouth 2 (two) times daily with a meal.    [provider]  omeprazole (PRILOSEC) 20 MG capsule Take 20 mg by mouth daily.    [provider]  valACYclovir (VALTREX) 500 MG tablet Take 500 mg by mouth daily.    [provider]   No Known Allergies Review of Systems +weakness  Physical Exam Weak appearing elderly gentleman No distress Appears chronically ill Diminished breath sounds S1 S2 Trace edema Opens eyes, interacts some, tracks me in the room. Answers a few questions appropriately.   Vital Signs: BP (!) 155/75 (BP Location: Right Arm)   Pulse 84   Temp 97.6 F (36.4 C)   Resp 20   Ht 5\' 7"  (1.702 m)   Wt 90.7 kg   SpO2 95%   BMI 31.32 kg/m  Pain Scale: PAINAD POSS *See Group Information*: 1-Acceptable,Awake and alert Pain Score: Asleep   SpO2: SpO2: 95 % O2 Device:SpO2: 95 % O2 Flow Rate: .O2 Flow Rate (L/min): 2 L/min  IO: Intake/output summary:   Intake/Output Summary (Last 24 hours) at 12/17/2019 1623 Last data filed at 12/17/2019 0800 Gross per 24 hour  Intake 362.54 ml  Output 1200 ml  Net -837.46 ml    LBM: Last BM Date: 12/15/19(per chart) Baseline Weight: Weight: 90.7 kg Most recent weight: Weight: 90.7 kg     Palliative Assessment/Data:   PPS 30%  Time In:  1500 Time Out:  1600 Time Total:   60  Greater than 50%  of this time was spent counseling and coordinating care related to the above assessment and plan.  Signed by: 12/17/19, MD   Please contact Palliative Medicine Team phone at [phone] for questions and concerns.  For individual provider:  See [phone]

## 2019-12-17 NOTE — Consult Note (Addendum)
[facility]  [phone] [phone]    INITIAL HEMATOLOGY CONSULTATION  Referring MD:  Dr. Lanae Boast  Reason for Referral: Thrombocytopenia  HPI: Clifford Dennis is a 79 year old male with a past medical history significant for hypertension, hyperlipidemia, dementia, and gout.  The patient currently resides at [facility] assisted [facility] and was brought to the emergency room after recurrent fall.  He was recently in the emergency room on 12/12/2019 following a fall as well, also striking his head.  CT of the head revealed moderate cerebral atrophy but no acute bleed and CT of the C-spine was negative for acute fractures.  Following his most recent fall, he hit his right arm but did not fall to the ground or strike his head.  In the ER, he was febrile with a temperature of 101.8, tachycardic with a heart rate in the 140s, and tachypneic.  He also became hypotensive and hypoxic in the emergency room. On admission, his WBC was 3.2, hemoglobin was normal at 13.7, and platelets were normal at 246,000.  Lactic acid was 4.8 and UA showed positive nitrites and moderate leukocyte esterase.  He was admitted for severe sepsis-suspected urinary or GI source.  He received cefepime and Flagyl in the emergency room and then was changed to vancomycin, cefepime, and Flagyl.  Blood cultures and urine culture positive for Proteus mirabilis. He is now receiving Zosyn only.  He had a CT of the abdomen pelvis with contrast performed on 12/14/2019 which showed no gallstones or biliary ductal dilatation but there was pericholecystic fluid which can be seen in acute cholecystitis, bilateral perirenal fat stranding which is nonspecific, masslike thickening at the ileocecal junction which could represent a collapsed bowel but soft tissue mass cannot be excluded, and slight enlargement of the prostate gland.  Abdominal ultrasound performed on 12/15/2019 showed normal right upper quadrant ultrasound  and the pericholecystic fluid had resolved.  CBC from today has been reviewed and his WBC is now 10.7, hemoglobin 12.5 and platelets have drifted down to 40,000.  The patient had been receiving heparin 5000 units subcu every 8 hours.  This medication was started on admission and was discontinued this morning. He has not prior labs available in our system or through [facility] prior to 12/12/2019.   When seen today, the patient is sleeping.  No family at the bedside. He does talk to me but keeps his eyes closed most of the time.  He states that he feels fine.  He reports that he does not have any pain.  He is oriented to person only.  History obtained from the chart and discussed with nursing.  He has not had any bleeding.  He remains n.p.o. after speech therapy evaluation due to severe aspiration risk. Hematology was asked to see the patient to make recommendations regarding his thrombocytopenia.   Past Medical History:  Diagnosis Date  . Dementia (HCC)   . Gout   . Hyperlipidemia   . Hypertension   :  History reviewed. No pertinent surgical history.:   CURRENT MEDS: Current Facility-Administered Medications  Medication Dose Route Frequency Provider Last Rate Last Admin  . acetaminophen (TYLENOL) tablet 650 mg  650 mg Oral Q4H PRN Lewie Chamber, MD   650 mg at 12/15/19 1303   Or  . acetaminophen (TYLENOL) suppository 650 mg  650 mg Rectal Q4H PRN Lewie Chamber, MD      . albuterol (PROVENTIL) (2.5 MG/3ML) 0.083% nebulizer solution 2.5 mg  2.5 mg Nebulization Q2H  PRN Eduard Clos, MD   2.5 mg at 12/16/19 0554  . allopurinol (ZYLOPRIM) tablet 300 mg  300 mg Oral Daily Kc, Ramesh, MD   300 mg at 12/15/19 1201  . aspirin EC tablet 81 mg  81 mg Oral Daily Kc, Ramesh, MD   81 mg at 12/15/19 1202  . atorvastatin (LIPITOR) tablet 20 mg  20 mg Oral QPM Kc, Ramesh, MD   20 mg at 12/15/19 1819  . buPROPion (WELLBUTRIN SR) 12 hr tablet 100 mg  100 mg Oral Daily Lewie Chamber, MD   100 mg  at 12/15/19 1201  . Chlorhexidine Gluconate Cloth 2 % PADS 6 each  6 each Topical Daily Lewie Chamber, MD   6 each at 12/17/19 1042  . escitalopram (LEXAPRO) tablet 10 mg  10 mg Oral Daily Lewie Chamber, MD   10 mg at 12/15/19 1201  . furosemide (LASIX) injection 20 mg  20 mg Intravenous Once Kc, Ramesh, MD      . hydrALAZINE (APRESOLINE) injection 10 mg  10 mg Intravenous Q4H PRN Eduard Clos, MD   10 mg at 12/16/19 0457  . insulin aspart (novoLOG) injection 0-6 Units  0-6 Units Subcutaneous Q6H Kc, Ramesh, MD   1 Units at 12/16/19 1812  . labetalol (NORMODYNE) injection 5 mg  5 mg Intravenous Q4H PRN Lanae Boast, MD   5 mg at 12/16/19 0841  . lactated ringers infusion   Intravenous Continuous Kc, Ramesh, MD 30 mL/hr at 12/17/19 0800 Rate Verify at 12/17/19 0800  . pantoprazole (PROTONIX) injection 40 mg  40 mg Intravenous Q24H Kc, Ramesh, MD   40 mg at 12/17/19 1038  . piperacillin-tazobactam (ZOSYN) IVPB 3.375 g  3.375 g Intravenous Q8H Herby Abraham, RPH   Stopped at 12/17/19 0749  . sodium chloride flush (NS) 0.9 % injection 3 mL  3 mL Intravenous Q12H Lewie Chamber, MD   3 mL at 12/17/19 1043  . valACYclovir (VALTREX) tablet 500 mg  500 mg Oral Daily Lewie Chamber, MD   500 mg at 12/15/19 1202      No Known Allergies:  History reviewed. No pertinent family history.:  Social History   Socioeconomic History  . Marital status: Widowed    Spouse name: Not on file  . Number of children: Not on file  . Years of education: Not on file  . Highest education level: Not on file  Occupational History  . Not on file  Tobacco Use  . Smoking status: Never Smoker  . Smokeless tobacco: Never Used  Substance and Sexual Activity  . Alcohol use: Not on file  . Drug use: Not on file  . Sexual activity: Not on file  Other Topics Concern  . Not on file  Social History Narrative  . Not on file   Social Determinants of Health   Financial Resource Strain:   . Difficulty of  Paying Living Expenses:   Food Insecurity:   . Worried About Programme researcher, broadcasting/film/video in the Last Year:   . Barista in the Last Year:   Transportation Needs:   . Freight forwarder (Medical):   Marland Kitchen Lack of Transportation (Non-Medical):   Physical Activity:   . Days of Exercise per Week:   . Minutes of Exercise per Session:   Stress:   . Feeling of Stress :   Social Connections:   . Frequency of Communication with Friends and Family:   . Frequency of Social Gatherings with Friends and  Family:   . Attends Religious Services:   . Active Member of Clubs or Organizations:   . Attends Banker Meetings:   Marland Kitchen Marital Status:   Intimate Partner Violence:   . Fear of Current or Ex-Partner:   . Emotionally Abused:   Marland Kitchen Physically Abused:   . Sexually Abused:   :  REVIEW OF SYSTEMS: Unable to obtain secondary to patient disorientation.  Nursing has not noted any bleeding.  Exam: Patient Vitals for the past 24 hrs:  BP Temp Temp src Pulse Resp SpO2  12/17/19 1058 -- -- -- 76 18 97 %  12/17/19 1057 -- -- -- 79 20 97 %  12/17/19 1000 (!) 167/89 -- -- 81 (!) 26 94 %  12/17/19 0900 (!) 143/81 -- -- 74 18 98 %  12/17/19 0852 -- (!) 96.4 F (35.8 C) Axillary -- -- --  12/17/19 0800 (!) 167/79 -- -- 78 20 98 %  12/17/19 0700 (!) 141/71 -- -- 72 17 98 %  12/17/19 0600 (!) 146/68 -- -- 74 18 97 %  12/17/19 0500 (!) 161/76 -- -- 74 16 100 %  12/17/19 0418 -- 97.7 F (36.5 C) Axillary -- -- --  12/17/19 0400 134/84 -- -- 74 17 100 %  12/17/19 0300 138/77 -- -- 75 18 99 %  12/17/19 0200 (!) 145/81 -- -- 74 19 100 %  12/17/19 0100 (!) 151/78 -- -- 73 19 100 %  12/17/19 0000 (!) 134/111 -- -- 88 (!) 21 100 %  12/16/19 2321 -- 97.6 F (36.4 C) Axillary -- -- --  12/16/19 2300 (!) 163/90 -- -- 78 (!) 26 98 %  12/16/19 2200 (!) 149/68 -- -- 75 19 97 %  12/16/19 2100 (!) 144/64 -- -- 86 18 94 %  12/16/19 2038 -- 97.9 F (36.6 C) Axillary -- -- --  12/16/19 2000 (!) 160/90 --  -- 80 (!) 22 95 %  12/16/19 1900 135/63 -- -- 77 20 96 %  12/16/19 1800 137/76 -- -- 89 (!) 21 97 %  12/16/19 1700 (!) 158/85 (!) 97.5 F (36.4 C) Axillary 89 20 97 %  12/16/19 1600 (!) 149/83 -- -- 93 (!) 22 97 %  12/16/19 1500 (!) 170/81 -- -- 85 (!) 23 92 %  12/16/19 1400 (!) 151/66 -- -- 96 (!) 22 99 %  12/16/19 1300 (!) 165/73 -- -- 87 (!) 23 97 %  12/16/19 1200 134/65 98 F (36.7 C) Axillary 90 (!) 24 97 %    General: Resting quietly, no distress Eyes:  no scleral icterus.   ENT: Oral mucosa and posterior pharynx dry   Lymphatics:  Negative cervical, supraclavicular or axillary adenopathy.   Respiratory: lungs were clear bilaterally without wheezing or crackles.   Cardiovascular:  Regular rate and rhythm, S1/S2, without murmur, rub or gallop.  There was no pedal edema.   GI:  abdomen was soft, flat, nontender, nondistended, without organomegaly.     Skin: Multiple ecchymoses to his bilateral arms.  He has several scrapes on his bilateral lower extremities which are not actively bleeding.  No petechiae noted Neuro: The patient is alert, but keeps eyes closed.  Oriented to person.  He knows that he is in the hospital but does not know what city he is in.  He does not know what year it is.  Neuro exam nonfocal.  LABS:  Lab Results  Component Value Date   WBC 10.7 (H) 12/17/2019   HGB 12.5 (L) 12/17/2019  HCT 37.9 (L) 12/17/2019   PLT 40 (L) 12/17/2019   GLUCOSE 159 (H) 12/17/2019   ALT 17 12/14/2019   AST 25 12/14/2019   NA 135 12/17/2019   K 3.5 12/17/2019   CL 102 12/17/2019   CREATININE 1.10 12/17/2019   BUN 27 (H) 12/17/2019   CO2 24 12/17/2019   INR 1.4 (H) 12/15/2019   HGBA1C 6.6 (H) 12/15/2019    CT Head Wo Contrast  Result Date: 12/12/2019 CLINICAL DATA:  79 year old male with multiple recent falls. Head injury. Dizziness and weakness. EXAM: CT HEAD WITHOUT CONTRAST CT CERVICAL SPINE WITHOUT CONTRAST TECHNIQUE: Multidetector CT imaging of the head and  cervical spine was performed following the standard protocol without intravenous contrast. Multiplanar CT image reconstructions of the cervical spine were also generated. COMPARISON:  None. FINDINGS: CT HEAD FINDINGS Brain: Cerebral volume loss appears generalized with mild ex vacuo appearing ventricular enlargement. No evidence of transependymal edema. No midline shift, mass effect, evidence of mass lesion, intracranial hemorrhage or evidence of cortically based acute infarction. No cortical encephalomalacia identified. Mild to moderate bilateral cerebral white matter hypodensity. Vascular: Calcified atherosclerosis at the skull base. No suspicious intracranial vascular hyperdensity. Skull: Intact. Sinuses/Orbits: Visualized paranasal sinuses and mastoids are clear. Other: No acute orbit or scalp soft tissue findings. CT CERVICAL SPINE FINDINGS Alignment: Preserved lordosis. Subtle anterolisthesis at C6-C7 appears to be degenerative. Cervicothoracic junction alignment is within normal limits. Bilateral posterior element alignment is within normal limits. Skull base and vertebrae: Visualized skull base is intact. No atlanto-occipital dissociation. C1 and C2 appear intact and normally aligned. No acute osseous abnormality identified. Soft tissues and spinal canal: No prevertebral fluid or swelling. No visible canal hematoma. Right greater than left cervical carotid calcified atherosclerosis. Otherwise negative noncontrast neck soft tissues. Disc levels: Widespread cervical facet arthropathy on the left. Mild for age disc and endplate degeneration. No cervical spinal stenosis suspected. Upper chest: Visible upper thoracic levels appear intact. Negative lung apices. IMPRESSION: 1. No acute traumatic injury identified in the head or cervical spine. 2. Cerebral volume loss and mild to moderate for age cerebral white matter changes - most commonly due to chronic small vessel disease. 3. Left side facet arthropathy but  otherwise mild for age cervical spine degeneration. Electronically Signed   By: Odessa Fleming M.D.   On: 12/12/2019 17:29   CT Cervical Spine Wo Contrast  Result Date: 12/12/2019 CLINICAL DATA:  79 year old male with multiple recent falls. Head injury. Dizziness and weakness. EXAM: CT HEAD WITHOUT CONTRAST CT CERVICAL SPINE WITHOUT CONTRAST TECHNIQUE: Multidetector CT imaging of the head and cervical spine was performed following the standard protocol without intravenous contrast. Multiplanar CT image reconstructions of the cervical spine were also generated. COMPARISON:  None. FINDINGS: CT HEAD FINDINGS Brain: Cerebral volume loss appears generalized with mild ex vacuo appearing ventricular enlargement. No evidence of transependymal edema. No midline shift, mass effect, evidence of mass lesion, intracranial hemorrhage or evidence of cortically based acute infarction. No cortical encephalomalacia identified. Mild to moderate bilateral cerebral white matter hypodensity. Vascular: Calcified atherosclerosis at the skull base. No suspicious intracranial vascular hyperdensity. Skull: Intact. Sinuses/Orbits: Visualized paranasal sinuses and mastoids are clear. Other: No acute orbit or scalp soft tissue findings. CT CERVICAL SPINE FINDINGS Alignment: Preserved lordosis. Subtle anterolisthesis at C6-C7 appears to be degenerative. Cervicothoracic junction alignment is within normal limits. Bilateral posterior element alignment is within normal limits. Skull base and vertebrae: Visualized skull base is intact. No atlanto-occipital dissociation. C1 and C2 appear intact and normally  aligned. No acute osseous abnormality identified. Soft tissues and spinal canal: No prevertebral fluid or swelling. No visible canal hematoma. Right greater than left cervical carotid calcified atherosclerosis. Otherwise negative noncontrast neck soft tissues. Disc levels: Widespread cervical facet arthropathy on the left. Mild for age disc and  endplate degeneration. No cervical spinal stenosis suspected. Upper chest: Visible upper thoracic levels appear intact. Negative lung apices. IMPRESSION: 1. No acute traumatic injury identified in the head or cervical spine. 2. Cerebral volume loss and mild to moderate for age cerebral white matter changes - most commonly due to chronic small vessel disease. 3. Left side facet arthropathy but otherwise mild for age cervical spine degeneration. Electronically Signed   By: Genevie Ann M.D.   On: 12/12/2019 17:29   CT Abdomen Pelvis W Contrast  Result Date: 12/14/2019 CLINICAL DATA:  Nausea vomiting. EXAM: CT ABDOMEN AND PELVIS WITH CONTRAST TECHNIQUE: Multidetector CT imaging of the abdomen and pelvis was performed using the standard protocol following bolus administration of intravenous contrast. CONTRAST:  148mL OMNIPAQUE IOHEXOL 300 MG/ML  SOLN COMPARISON:  None. FINDINGS: Lower chest: Enlarged heart. Calcific atherosclerotic disease of the coronary arteries and aorta. Small hiatal hernia. Hepatobiliary: Normal appearance of the liver. The gallbladder is normally distended. No radiopaque gallstones are seen. However there is a small amount of pericholecystic fluid. The common bile duct is not dilated. Pancreas: Unremarkable. No pancreatic ductal dilatation or surrounding inflammatory changes. Spleen: Normal in size without focal abnormality. Adrenals/Urinary Tract: Normal adrenal glands. No evidence of hydronephrosis or nephrolithiasis. Bilateral perirenal fat stranding. Normal ureters and urinary bladder. Stomach/Bowel: Stomach is within normal limits. Appendix appears normal. No evidence of bowel wall distention, or inflammatory changes. Masslike thickening at the ileocecal junction may represent collapsed bowel, however true soft tissue mass cannot be excluded. Vascular/Lymphatic: Aortic atherosclerosis. No enlarged abdominal or pelvic lymph nodes. Reproductive: Slight enlargement of the prostate gland. Other:  No abdominal wall hernia or abnormality. No abdominopelvic ascites. Musculoskeletal: Spondylosis of the lumbosacral spine. IMPRESSION: 1. No radiopaque gallstones or biliary ductal dilation is seen, however there is a small amount of pericholecystic fluid, which may be seen in early acute cholecystitis. 2. Bilateral perirenal fat stranding, nonspecific. Please correlate to urinalysis. 3. Masslike thickening at the ileocecal junction may represent collapsed bowel, however true soft tissue mass cannot be excluded. Please correlate to colonoscopy when clinically feasible. 4. Slight enlargement of the prostate gland. 5. Small hiatal hernia. Aortic Atherosclerosis (ICD10-I70.0). Electronically Signed   By: Fidela Salisbury M.D.   On: 12/14/2019 10:44   DG Chest Port 1 View  Result Date: 12/17/2019 CLINICAL DATA:  CHF. Urosepsis. EXAM: PORTABLE CHEST 1 VIEW COMPARISON:  12/16/2019 FINDINGS: The cardiac silhouette remains upper limits of normal in size. Aortic atherosclerosis is noted. Hazy and patchy airspace and interstitial opacities throughout the right greater than left lungs are unchanged. There are likely small bilateral pleural effusions. No pneumothorax is identified. IMPRESSION: Unchanged right greater than left lung opacities which may reflect asymmetric edema or pneumonia. Electronically Signed   By: Logan Bores M.D.   On: 12/17/2019 08:22   DG Chest Port 1 View  Result Date: 12/16/2019 CLINICAL DATA:  Urosepsis. Congestive heart failure. EXAM: PORTABLE CHEST 1 VIEW COMPARISON:  One-view chest x-ray 12/16/2019 8:01 a.m. FINDINGS: Heart size is upper limits of normal. Atherosclerotic calcifications are present in the aorta. Asymmetric interstitial and airspace disease is worse on the right. There is no significant interval change. Effusions are suspected. IMPRESSION: 1. No significant interval change.  2. Asymmetric interstitial and airspace disease is worse on the right, compatible with asymmetric  edema or infection. 3. Probable small pleural effusions. Electronically Signed   By: Marin Roberts M.D.   On: 12/16/2019 13:28   DG Chest Port 1 View  Result Date: 12/16/2019 CLINICAL DATA:  Hypoxia. EXAM: PORTABLE CHEST 1 VIEW COMPARISON:  Dec 14, 2019. FINDINGS: Stable cardiomediastinal silhouette. No pneumothorax or pleural effusion is noted. New large bilateral lung opacities are noted, right greater than left, consistent with multifocal pneumonia or possibly edema. Bony thorax is unremarkable. IMPRESSION: New large bilateral lung opacities are noted, right greater than left, consistent with multifocal pneumonia or possibly edema. Aortic Atherosclerosis (ICD10-I70.0). Electronically Signed   By: Lupita Raider M.D.   On: 12/16/2019 08:31   DG Chest Port 1 View  Result Date: 12/14/2019 CLINICAL DATA:  Fevers EXAM: PORTABLE CHEST 1 VIEW COMPARISON:  None. FINDINGS: Cardiac shadow is within normal limits. Aortic calcifications are seen. The lungs are clear. No bony abnormality is noted. IMPRESSION: No acute abnormality seen. Electronically Signed   By: Alcide Clever M.D.   On: 12/14/2019 08:10   DG Knee Complete 4 Views Right  Result Date: 12/12/2019 CLINICAL DATA:  Diffuse right knee pain following a fall. EXAM: RIGHT KNEE - COMPLETE 4+ VIEW COMPARISON:  None. FINDINGS: Mild to moderate medial joint space narrowing with associated minimal spur formation. Minimal lateral and patellofemoral spur formation. No fracture, dislocation or effusion. IMPRESSION: No fracture. Minimal degenerative changes. Electronically Signed   By: Beckie Salts M.D.   On: 12/12/2019 16:00   ECHOCARDIOGRAM COMPLETE  Result Date: 12/16/2019    ECHOCARDIOGRAM REPORT   Patient Name:   Clifford Dennis Date of Exam: 12/16/2019 Medical Rec #:  [id number]     Height:       67.0 in Accession #:    [id number]    Weight:       200.0 lb Date of Birth:  [birth year]     BSA:          2.022 m Patient Age:    78 years      BP:            117/70 mmHg Patient Gender: M             HR:           87 bpm. Exam Location:  Inpatient Procedure: 2D Echo, Cardiac Doppler and Color Doppler Indications:     CHF  History:         Patient has no prior history of Echocardiogram examinations.                  Signs/Symptoms:Altered Mental Status; Risk                  Factors:Hypertension, Diabetes and Dyslipidemia. Dementia,                  sepsis.  Sonographer:     Lavenia Atlas Referring Phys:  [id number] RAMESH KC Diagnosing Phys: Yates Decamp MD  Sonographer Comments: Altered mental status. IMPRESSIONS  1. Left ventricular ejection fraction, by estimation, is 60 to 65%. The left ventricle has normal function. The left ventricle has no regional wall motion abnormalities. Left ventricular diastolic parameters were normal.  2. Right ventricular systolic function is normal. The right ventricular size is normal. There is mildly elevated pulmonary artery systolic pressure.  3. The mitral valve is normal in structure. Mild mitral valve regurgitation. No evidence of  mitral stenosis.  4. Node of Arentius (normal variant) noted on left coronary cusp). The aortic valve is normal in structure. Aortic valve regurgitation is not visualized. No aortic stenosis is present. FINDINGS  Left Ventricle: Left ventricular ejection fraction, by estimation, is 60 to 65%. The left ventricle has normal function. The left ventricle has no regional wall motion abnormalities. The left ventricular internal cavity size was normal in size. There is  no left ventricular hypertrophy. Left ventricular diastolic parameters were normal. Right Ventricle: The right ventricular size is normal. No increase in right ventricular wall thickness. Right ventricular systolic function is normal. There is mildly elevated pulmonary artery systolic pressure. The tricuspid regurgitant velocity is 3.12  m/s, and with an assumed right atrial pressure of 3 mmHg, the estimated right ventricular systolic pressure is  41.9 mmHg. Left Atrium: Left atrial size was normal in size. Right Atrium: Right atrial size was normal in size. Pericardium: There is no evidence of pericardial effusion. Mitral Valve: The mitral valve is normal in structure. Normal mobility of the mitral valve leaflets. Mild mitral valve regurgitation. No evidence of mitral valve stenosis. Tricuspid Valve: The tricuspid valve is normal in structure. Tricuspid valve regurgitation is trivial. No evidence of tricuspid stenosis. Aortic Valve: Node of Arentius (normal variant) noted on left coronary cusp). The aortic valve is normal in structure. Aortic valve regurgitation is not visualized. Aortic regurgitation PHT measures 433 msec. No aortic stenosis is present. Pulmonic Valve: The pulmonic valve was normal in structure. Pulmonic valve regurgitation is trivial. No evidence of pulmonic stenosis. Aorta: The aortic root is normal in size and structure. IAS/Shunts: No atrial level shunt detected by color flow Doppler.  LEFT VENTRICLE PLAX 2D LVIDd:         4.50 cm  Diastology LVIDs:         3.40 cm  LV e' lateral:   8.49 cm/s LV PW:         1.00 cm  LV E/e' lateral: 8.7 LV IVS:        0.90 cm  LV e' medial:    9.25 cm/s LVOT diam:     2.20 cm  LV E/e' medial:  8.0 LV SV:         59 LV SV Index:   29 LVOT Area:     3.80 cm  RIGHT VENTRICLE RV Basal diam:  2.40 cm RV S prime:     18.50 cm/s TAPSE (M-mode): 2.9 cm LEFT ATRIUM             Index       RIGHT ATRIUM           Index LA diam:        3.60 cm 1.78 cm/m  RA Area:     17.30 cm LA Vol (A2C):   46.9 ml 23.19 ml/m RA Volume:   44.40 ml  21.96 ml/m LA Vol (A4C):   49.5 ml 24.48 ml/m LA Biplane Vol: 50.2 ml 24.82 ml/m  AORTIC VALVE LVOT Vmax:   92.70 cm/s LVOT Vmean:  66.000 cm/s LVOT VTI:    0.155 m AI PHT:      433 msec  AORTA Ao Root diam: 3.40 cm MITRAL VALVE               TRICUSPID VALVE MV Area (PHT): 3.99 cm    TR Peak grad:   38.9 mmHg MV Decel Time: 190 msec    TR Vmax:        312.00  cm/s MV E velocity:  74.10 cm/s MV A velocity: 81.00 cm/s  SHUNTS MV E/A ratio:  0.91        Systemic VTI:  0.16 m                            Systemic Diam: 2.20 cm Yates Decamp MD Electronically signed by Yates Decamp MD Signature Date/Time: 12/16/2019/12:30:31 PM    Final    US Abdomen Limited RUQ  Result Date: 12/15/2019 CLINICAL DATA:  Pericholecystic fluid on CT. EXAM: ULTRASOUND ABDOMEN LIMITED RIGHT UPPER QUADRANT COMPARISON:  CT abdomen pelvis from yesterday. FINDINGS: Gallbladder: No gallstones or wall thickening visualized. No pericholecystic fluid. No sonographic Murphy sign noted by sonographer. Common bile duct: Diameter: 3 mm, normal. Liver: No focal lesion identified. Within normal limits in parenchymal echogenicity. Portal vein is patent on color Doppler imaging with normal direction of blood flow towards the liver. Other: None. IMPRESSION: 1. Normal right upper quadrant ultrasound. Pericholecystic fluid has resolved. Electronically Signed   By: Obie Dredge M.D.   On: 12/15/2019 12:48   ASSESSMENT AND PLAN:  1.  Thrombocytopenia 2.  Severe sepsis/Urosepsis with Proteus bacteremia 3.  Acute hypoxic respiratory failure-?ARDS versus multifocal pneumonia versus asymmetric acute pulmonary edema/acute systolic CHF 4.  Elevated troponin 5.  AKI 6.  Hypertension 7.  Diabetes mellitus 8.  Acute metabolic encephalopathy 9.  Dementia/anxiety/depression 10.  Hyperlipidemia 11.  Mild leukocytosis, improving  -Labs have been reviewed and platelet count slowly trending downward this admission.  Suspect due to severe sepsis/urosepsis and medications.  We will add on a DIC panel as well as a HIT panel given recent heparin administration.  Agree with stopping heparin for now.  Will also review peripheral blood smear. -The patient is not actively bleeding.  Recommend transfusion if platelet count is less than 20,000 or active bleeding. -The patient has mild leukocytosis today likely due to infection.  This is trending  downward.  Continue to monitor this. -Continue IV antibiotics for treatment of acute infection.  Thank you for this referral.  Clenton Pare, DNP, AGPCNP-BC, AOCNP Mon/Tues/Thurs/Fri 7am-5pm; Off Wednesdays Cell: [phone]  Addendum  I have seen the patient, examined him. I agree with the assessment and and plan and have edited the notes.   I have reviewed his chart, lab results and his peripheral blood smear today.  No schistocytes on smear, low platelet count, occasional giant platelet.  RBC morphology was unremarkable.  He developed worsening thrombocytopenia after hospital admission, received heparin from May 23 to May 26, stopped in today.  Mild thrombocytopenia developed on 5/24, the timing is two early for HIT, and he does not have clinical suspicion for DVT.  So HIT is low possibility but it's reasonable to check a HIT antibody to rule it out.  I think he is thrombocytopenia is likely related to his infection, urosepsis and aspiration pneumonia.  He is not clinically bleeding, will continue monitoring closely, consider platelet transfusion if platelet less than 20K or if he develops significant bleeding.  Malachy Mood  12/17/2019

## 2019-12-18 LAB — BASIC METABOLIC PANEL
Anion gap: 15 (ref 5–15)
BUN: 29 mg/dL — ABNORMAL HIGH (ref 8–23)
CO2: 23 mmol/L (ref 22–32)
Calcium: 8.4 mg/dL — ABNORMAL LOW (ref 8.9–10.3)
Chloride: 103 mmol/L (ref 98–111)
Creatinine, Ser: 1.05 mg/dL (ref 0.61–1.24)
GFR calc Af Amer: >60 mL/min (ref >60)
GFR calc non Af Amer: >60 mL/min (ref >60)
Glucose, Bld: 110 mg/dL — ABNORMAL HIGH (ref 70–99)
Potassium: 3.2 mmol/L — ABNORMAL LOW (ref 3.5–5.1)
Sodium: 141 mmol/L (ref 135–145)

## 2019-12-18 LAB — CBC
HCT: 39.5 % (ref 39.0–52.0)
Hemoglobin: 13 g/dL (ref 13.0–17.0)
MCH: 28.6 pg (ref 26.0–34.0)
MCHC: 32.9 g/dL (ref 30.0–36.0)
MCV: 86.8 fL (ref 80.0–100.0)
Platelets: 21 10*3/uL — CL (ref 150–400)
RBC: 4.55 MIL/uL (ref 4.22–5.81)
RDW: 14.6 % (ref 11.5–15.5)
WBC: 8.6 10*3/uL (ref 4.0–10.5)
nRBC: 0 % (ref 0.0–0.2)

## 2019-12-18 LAB — BRAIN NATRIURETIC PEPTIDE: B Natriuretic Peptide: 406.9 pg/mL — ABNORMAL HIGH (ref 0.0–100.0)

## 2019-12-18 LAB — HEPARIN INDUCED PLATELET AB (HIT ANTIBODY): Heparin Induced Plt Ab: 0.097 OD (ref 0.000–0.400)

## 2019-12-18 LAB — GLUCOSE, CAPILLARY
Glucose-Capillary: 115 mg/dL — ABNORMAL HIGH (ref 70–99)
Glucose-Capillary: 126 mg/dL — ABNORMAL HIGH (ref 70–99)
Glucose-Capillary: 149 mg/dL — ABNORMAL HIGH (ref 70–99)
Glucose-Capillary: 98 mg/dL (ref 70–99)

## 2019-12-18 LAB — PROCALCITONIN: Procalcitonin: 12.48 ng/mL

## 2019-12-18 MED ORDER — POTASSIUM CHLORIDE 10 MEQ/100ML IV SOLN
10.0000 meq | INTRAVENOUS | Status: AC
  Administered 2019-12-18 (×2): 10 meq via INTRAVENOUS
  Filled 2019-12-18 (×2): qty 100

## 2019-12-18 MED ORDER — ALPRAZOLAM 0.5 MG PO TABS
0.5000 mg | ORAL_TABLET | Freq: Three times a day (TID) | ORAL | Status: DC | PRN
  Administered 2019-12-18 – 2019-12-21 (×5): 0.5 mg via ORAL
  Filled 2019-12-18 (×5): qty 1

## 2019-12-18 NOTE — Progress Notes (Signed)
PROGRESS NOTE    Clifford Dennis  ZOX:[id number] DOB: March 28, 1941 DOA: 12/14/2019 PCP: System, Pcp Not In   Chef Complaints: Fall and Fever  Brief Narrative: [name] with PMH HTN,HLD,dementia,gout from [facility] sent for recurrent fall.He was just in the ER on 5/21 after a fall as well, also striking his head. The CT of his head revealed moderate cerebral atrophy but no acute bleed.  CT of his C-spine also was negative for acute injuries.  X-ray of right knee also negative.  On 5/23 he stated that he hit his right arm but did not fall to the ground nor strike his head.  He is a difficult historian and most of HPI is obtained from chart review. On 12/12/2019 it was reported that he was having dizziness and weakness more so on the left side since Saturday of last week.  There is reported history of decreased oral intake.Urinalysis and lab work-up were relatively unremarkable and after imaging studies, he was back to his mental baseline and he was discharged back to his ALF. He did require a straight catheterization due to difficulty voiding however prior to being discharged back. In ED:he was very lethargic, there is report of some vomiting and diarrhea  with vague abdominal pain.He is also endorsing some irritation with voiding. He was febrile,101.8,tachycardic 142,tachypneic 22, and initial BP 162/78 however this slowly down trended to 109/65 , also became hypoxic on room air satting down into the 80s and was placed on 2 L [place].Notable labs include: Lactic acid 4.8,BUN 16,creatinine 1.43. WBC 3.2,94% neutrophils.UA, large Hgb, 100 protein,positive nitrites, moderate leukocyte esterase, greater than 50 WBC. In the ER:he was given 3-4 L NS BS. Patient was admitted for severe sepsis/lactic acidosis/UTI. Blood culture and urine culture grew  Proteus. 5/25- respiratory distress, with uncontrolled hypertension and 200: 60 still bilateral opacities pulmonary edema versus multifocal pneumonia given 60 of IV  Lasix and steroids and had significant improvement with good urine output and seen by cardiology.  Subjective: Mumbling words, eyes closed " Leave me alone" Able to tell me his name. when asked about where he is states " I know where I am at, leave me alone" On 2 L nasal cannula had good UOP- 4200 mL WBC improved, PLT dropped further  Assessment & Plan:  Severe sepsis from Proteus bacteremia and Proteus UTI : Clinically improving blood pressure stable.  Chest x-ray multifocal pneumonia versus asymmetric edema. Coag neg staph bacteremia- likely contamination.Chest x-ray shows multifocal pneumonia versus edema-Cont on emperic Zosyn 5/25 to cover aspiration/CAP, overall stable, deescalate to unasyn soon. Procal is improving, vitals stable. Mild peri-cholecyttic fluid in the CT scan which is resolved on the right upper quadrant ultrasound.  monitor.   Proteus UTI -abx as above.  Acute metabolic encephalopathy from #1 improving.  In the setting of dementia.  He is more alert,awake and following commands. At baseline does not have good short-term memory with dementia per son. SLP eval for po-starting DYS 3 diet. Resume home bupropion/Celexa, Xanax, Neurontin.   Acute hypoxic respiratory failure with respiratory distress : Chest x-ray multiple focal pneumonia versus asymmetric edema improved with Lasix x2.   CXR multifocal pneumonia versus asymmetric pulmonary edema: symptoms improved with Lasix x2 doses.  Off oxygen this morning. Suspecting acute pulmonary edema from uncontrolled htn and iv hydration. d/c ivf if able to take po. Continue on empiric abx as above. Seen by cardiology, Echocardiogram was obtained overall stable.  Repeat chest x-ray 1-2 days.   Positive troponins  form demand mismatch:trops flat. Seen by cardiology.    Hypomagnesemia- repleted  Hypokalemia : Replete today.   NSVT- stable Lactic acidosis/metabolic acidosis. Lactate improved. AKI: resolved. Suspect from sepsis/volume  depletion. Hold Home  lisinopril Metformin on Hold.  Poorly controlled hypertension: Lisinopril on hold due to AKI and NPO.  Continue labetalol as needed.    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 sugar controlled, cont to monitor and cont ssi  Coagulopathy INR 1.4.Likely from sepsis.  Monitor.  Thrombocytopenia: Platelet count continues to drop. discussed with hem- HIT panel pending, doubt. Cont to monitor and transfuse as per hem  ( or if hb is dropping)   Recent Labs  Lab 12/15/19 0435 12/16/19 0246 12/17/19 0222 12/17/19 1149 12/18/19 0448  PLT 144* 90* 40* 36* 21*   Impaired mobility and ADLs:Moved to ALF 3 wks ago and has declined rapidly as per son.He stopped eating since moving, PT/OT eval. patient has had frequent falls at ALF. Pt/ot   Hyperlipidemia resume statins   GOC: DNR, palliative following- if improves return to facility with hospice  DVT prophylaxis:SCD. Heparin SQ on hold for thrombocytopenia. Code Status: DNR Family Communication: plan of care discussed with patient.  Discussed with palliative care, oncology team today. Son has been informed by palliative care today.   Status is: Inpatient Remains inpatient appropriate because:Altered mental status, IV treatments appropriate due to intensity of illness or inability to take PO, Inpatient level of care appropriate due to severity of illness and For ongoing treatment of sepsis, bacteremia  Dispo: The patient is from: Home              Anticipated d/c is to: TBD              Anticipated d/c date is: 2 days              Patient currently is not medically stable to d/c.  Nutrition- NPO until SLP eval Diet Order            DIET DYS 3 Room service appropriate? Yes; Fluid consistency: Thin  Diet effective now              Body mass index is 31.32 kg/m.  Consultants:see note  Procedures:  CT Abdomen 1.No radiopaque gallstones or biliary ductal dilation is seen, however there is a small amount of  pericholecystic fluid, which may be seen in early acute cholecystitis. 2. Bilateral perirenal fat stranding, nonspecific. Please correlate to urinalysis. 3. Masslike thickening at the ileocecal junction may represent collapsed bowel, however true soft tissue mass cannot be excluded. Please correlate to colonoscopy when clinically feasible. 4. Slight enlargement of the prostate gland. 5. Small hiatal hernia.  TTE 1. Left ventricular ejection fraction, by estimation, is 60 to 65%. The  left ventricle has normal function. The left ventricle has no regional  wall motion abnormalities. Left ventricular diastolic parameters were  normal.  2. Right ventricular systolic function is normal. The right ventricular  size is normal. There is mildly elevated pulmonary artery systolic  pressure.  3. The mitral valve is normal in structure. Mild mitral valve  regurgitation. No evidence of mitral stenosis.  4. Node of Arentius (normal variant) noted on left coronary cusp). The  aortic valve is normal in structure. Aortic valve regurgitation is not  visualized. No aortic stenosis is present.   Microbiology:see note  Medications: Scheduled Meds: . allopurinol  300 mg Oral Daily  . atorvastatin  20 mg Oral QPM  . buPROPion  100 mg Oral Daily  . Chlorhexidine Gluconate Cloth  6 each Topical Daily  . escitalopram  10 mg Oral Daily  . insulin aspart  0-6 Units Subcutaneous Q6H  . pantoprazole (PROTONIX) IV  40 mg Intravenous Q24H  . sodium chloride flush  3 mL Intravenous Q12H  . valACYclovir  500 mg Oral Daily   Continuous Infusions: . lactated ringers 30 mL/hr at 12/17/19 0800  . lactated ringers 30 mL/hr at 12/18/19 0234  . piperacillin-tazobactam (ZOSYN)  IV 3.375 g (12/18/19 1136)    Antimicrobials: Anti-infectives (From admission, onward)   Start     Dose/Rate Route Frequency Ordered Stop   12/16/19 1200  piperacillin-tazobactam (ZOSYN) IVPB 3.375 g     3.375 g 12.5 mL/hr over 240  Minutes Intravenous Every 8 hours 12/16/19 1035     12/15/19 1300  vancomycin (VANCOCIN) IVPB 1000 mg/200 mL premix  Status:  Discontinued     1,000 mg 200 mL/hr over 60 Minutes Intravenous Every 24 hours 12/14/19 1237 12/15/19 0526   12/15/19 0600  cefTRIAXone (ROCEPHIN) 2 g in sodium chloride 0.9 % 100 mL IVPB  Status:  Discontinued     2 g 200 mL/hr over 30 Minutes Intravenous Every 24 hours 12/15/19 0527 12/16/19 1035   12/14/19 2000  ceFEPIme (MAXIPIME) 2 g in sodium chloride 0.9 % 100 mL IVPB  Status:  Discontinued     2 g 200 mL/hr over 30 Minutes Intravenous Every 12 hours 12/14/19 1210 12/15/19 0526   12/14/19 1600  metroNIDAZOLE (FLAGYL) IVPB 500 mg  Status:  Discontinued     500 mg 100 mL/hr over 60 Minutes Intravenous Every 8 hours 12/14/19 1210 12/15/19 0526   12/14/19 1415  valACYclovir (VALTREX) tablet 500 mg     500 mg Oral Daily 12/14/19 1412     12/14/19 1230  vancomycin (VANCOREADY) IVPB 2000 mg/400 mL     2,000 mg 200 mL/hr over 120 Minutes Intravenous  Once 12/14/19 1223 12/14/19 1655   12/14/19 0830  ceFEPIme (MAXIPIME) 2 g in sodium chloride 0.9 % 100 mL IVPB     2 g 200 mL/hr over 30 Minutes Intravenous  Once 12/14/19 0815 12/14/19 1117   12/14/19 0830  metroNIDAZOLE (FLAGYL) IVPB 500 mg     500 mg 100 mL/hr over 60 Minutes Intravenous  Once 12/14/19 0815 12/14/19 1117    Objective: Vitals: Today's Vitals   12/17/19 2026 12/17/19 2056 12/18/19 0402 12/18/19 1300  BP: 140/82  (!) 151/74 (!) 135/100  Pulse: 76  68 78  Resp: 18   18  Temp: 98 F (36.7 C)  98 F (36.7 C) 98.1 F (36.7 C)  TempSrc: Oral  Oral Oral  SpO2: 98%  98% 97%  Weight:      Height:      PainSc:  Asleep      Intake/Output Summary (Last 24 hours) at 12/18/2019 1414 Last data filed at 12/18/2019 1300 Gross per 24 hour  Intake 863.99 ml  Output 1500 ml  Net -636.01 ml   Filed Weights   12/14/19 0819  Weight: 90.7 kg   Weight change:    Intake/Output from previous day: 05/26  0701 - 05/27 0700 In: 716.9 [I.V.:626.1; IV Piggyback:90.8] Out: 1100 [Urine:1100] Intake/Output this shift: Total I/O In: 200 [P.O.:200] Out: 400 [Urine:400]  Examination: General exam: More alert awake oriented to self, poor short-term memory, unremarkable not in distress  HEENT:Oral mucosa dry avoid thick coating and secretion on the tongue, Ear/Nose WNL grossly, dentition normal. Respiratory  system: bilaterally clear,no wheezing or crackles,no use of accessory muscle Cardiovascular system: S1 & S2 +, No JVD,. Gastrointestinal system: Abdomen soft, NT,ND, BS+ Nervous System:Alert, awake, moving extremities and grossly nonfocal Extremities: No edema, distal peripheral pulses palpable.  Skin: No rashes,no icterus. MSK: Normal muscle bulk,tone, power.  Data Reviewed: I have personally reviewed following labs and imaging studies CBC: Recent Labs  Lab 12/12/19 1602 12/12/19 1602 12/14/19 0725 12/14/19 0725 12/15/19 0435 12/16/19 0246 12/17/19 0222 12/17/19 1149 12/18/19 0448  WBC 9.1   < > 3.2*  --  18.6* 14.7* 10.7*  --  8.6  NEUTROABS 7.1  --  2.9  --  17.1*  --   --   --   --   HGB 12.8*   < > 13.7  --  11.8* 11.9* 12.5*  --  13.0  HCT 39.3   < > 42.8  --  36.4* 35.4* 37.9*  --  39.5  MCV 86.8   < > 87.9  --  88.8 85.5 87.3  --  86.8  PLT 228   < > 246   < > 144* 90* 40* 36* 21*   < > = values in this interval not displayed.   Basic Metabolic Panel: Recent Labs  Lab 12/15/19 0435 12/16/19 0246 12/16/19 0715 12/17/19 0222 12/18/19 0448  NA 135 136 136 135 141  K 3.9 3.4* 3.6 3.5 3.2*  CL 105 107 105 102 103  CO2 19* 19* 18* 24 23  GLUCOSE 91 87 92 159* 110*  BUN 20 21 20  27* 29*  CREATININE 1.48* 1.17 1.09 1.10 1.05  CALCIUM 7.5* 7.7* 7.6* 7.9* 8.4*  MG 0.7*  --  1.3* 1.9  --    GFR: Estimated Creatinine Clearance: 62.2 mL/min (by C-G formula based on SCr of 1.05 mg/dL). Liver Function Tests: Recent Labs  Lab 12/12/19 1602 12/14/19 0725  AST 21 25    ALT 16 17  ALKPHOS 79 109  BILITOT 0.9 1.5*  PROT 6.6 6.7  ALBUMIN 4.0 4.0   Recent Labs  Lab 12/14/19 0725  LIPASE 39   No results for input(s): AMMONIA in the last 168 hours. Coagulation Profile: Recent Labs  Lab 12/14/19 0725 12/15/19 0435 12/17/19 1149  INR 1.2 1.4* 0.9   Cardiac Enzymes: No results for input(s): CKTOTAL, CKMB, CKMBINDEX, TROPONINI in the last 168 hours. BNP (last 3 results) No results for input(s): PROBNP in the last 8760 hours. HbA1C: No results for input(s): HGBA1C in the last 72 hours. CBG: Recent Labs  Lab 12/17/19 1639 12/17/19 2015 12/18/19 0030 12/18/19 0721 12/18/19 1133  GLUCAP 144* 129* 126* 98 149*   Lipid Profile: No results for input(s): CHOL, HDL, LDLCALC, TRIG, CHOLHDL, LDLDIRECT in the last 72 hours. Thyroid Function Tests: No results for input(s): TSH, T4TOTAL, FREET4, T3FREE, THYROIDAB in the last 72 hours. Anemia Panel: No results for input(s): VITAMINB12, FOLATE, FERRITIN, TIBC, IRON, RETICCTPCT in the last 72 hours. Sepsis Labs: Recent Labs  Lab 12/14/19 2055 12/15/19 0030 12/15/19 0435 12/16/19 0850 12/17/19 0222 12/18/19 0448  PROCALCITON  --   --  97.64  --  27.80 12.48  LATICACIDVEN 3.3* 3.3* 2.7* 1.8  --   --     Recent Results (from the past 240 hour(s))  Urine Culture     Status: None   Collection Time: 12/12/19  3:36 PM   Specimen: Urine, Random  Result Value Ref Range Status   Specimen Description   Final    URINE, RANDOM Performed at  [facility], [address] [place], [place] [address]    Special Requests   Final    NONE Performed at [facility], [address], [place], [place] [address]    Culture   Final    NO GROWTH Performed at [facility], [address] [place], [place] [address]    Report Status 12/14/2019 FINAL  Final  Blood Culture (routine x 2)     Status: Abnormal   Collection Time: 12/14/19  7:25 AM   Specimen: BLOOD  Result  Value Ref Range Status   Specimen Description   Final    BLOOD LEFT ANTECUBITAL Performed at [facility], [address] [place], [place] [address]    Special Requests   Final    BOTTLES DRAWN AEROBIC ONLY Blood Culture adequate volume Performed at [facility], [address] [place], [place] [address]    Culture  Setup Time   Final    GRAM NEGATIVE RODS GRAM POSITIVE COCCI IN CLUSTERS AEROBIC BOTTLE ONLY CRITICAL RESULT CALLED TO, READ BACK BY AND VERIFIED WITH: J. GRIMSLEY,PHARMD 0518 12/15/2019 T. TYSOR    Culture (A)  Final    PROTEUS MIRABILIS STAPHYLOCOCCUS SPECIES (COAGULASE NEGATIVE) THE SIGNIFICANCE OF ISOLATING THIS ORGANISM FROM A SINGLE SET OF BLOOD CULTURES WHEN MULTIPLE SETS ARE DRAWN IS UNCERTAIN. PLEASE NOTIFY THE MICROBIOLOGY DEPARTMENT WITHIN ONE WEEK IF SPECIATION AND SENSITIVITIES ARE REQUIRED. Performed at [facility], [address], [place], [place] [address]    Report Status 12/17/2019 FINAL  Final   Organism ID, Bacteria PROTEUS MIRABILIS  Final      Susceptibility   Proteus mirabilis - MIC*    AMPICILLIN <=2 SENSITIVE Sensitive     CEFAZOLIN <=4 SENSITIVE Sensitive     CEFEPIME <=1 SENSITIVE Sensitive     CEFTAZIDIME <=1 SENSITIVE Sensitive     CEFTRIAXONE <=1 SENSITIVE Sensitive     CIPROFLOXACIN <=0.25 SENSITIVE Sensitive     GENTAMICIN <=1 SENSITIVE Sensitive     IMIPENEM 2 SENSITIVE Sensitive     TRIMETH/SULFA <=20 SENSITIVE Sensitive     AMPICILLIN/SULBACTAM <=2 SENSITIVE Sensitive     PIP/TAZO <=4 SENSITIVE Sensitive     * PROTEUS MIRABILIS  Blood Culture (routine x 2)     Status: Abnormal   Collection Time: 12/14/19  7:25 AM   Specimen: BLOOD  Result Value Ref Range Status   Specimen Description   Final    BLOOD BLOOD RIGHT FOREARM Performed at [facility], [address] [place], [place] [address]    Special Requests   Final    BOTTLES DRAWN AEROBIC AND ANAEROBIC Blood  Culture adequate volume Performed at [facility], [address] [place], [place] [address]    Culture  Setup Time   Final    GRAM NEGATIVE RODS CRITICAL VALUE NOTED.  VALUE IS CONSISTENT WITH PREVIOUSLY REPORTED AND CALLED VALUE. IN BOTH AEROBIC AND ANAEROBIC BOTTLES    Culture (A)  Final    PROTEUS MIRABILIS SUSCEPTIBILITIES PERFORMED ON PREVIOUS CULTURE WITHIN THE LAST 5 DAYS. Performed at [facility], [address], [place], [place] [address]    Report Status 12/17/2019 FINAL  Final  Urine culture     Status: Abnormal   Collection Time: 12/14/19  7:25 AM   Specimen: In/Out Cath Urine  Result Value Ref Range Status   Specimen Description   Final    IN/OUT CATH URINE Performed at  [facility], [address] [place], [place] [address]    Special Requests   Final    NONE Performed at [facility], [address] [place], [place] [address]    Culture >=100,000 COLONIES/mL PROTEUS MIRABILIS (A)  Final   Report Status 12/16/2019 FINAL  Final   Organism ID, Bacteria PROTEUS MIRABILIS (A)  Final      Susceptibility   Proteus mirabilis - MIC*    AMPICILLIN <=2 SENSITIVE Sensitive     CEFAZOLIN <=4 SENSITIVE Sensitive     CEFTRIAXONE <=1 SENSITIVE Sensitive     CIPROFLOXACIN <=0.25 SENSITIVE Sensitive     GENTAMICIN <=1 SENSITIVE Sensitive     IMIPENEM 1 SENSITIVE Sensitive     NITROFURANTOIN 128 RESISTANT Resistant     TRIMETH/SULFA <=20 SENSITIVE Sensitive     AMPICILLIN/SULBACTAM <=2 SENSITIVE Sensitive     PIP/TAZO <=4 SENSITIVE Sensitive     * >=100,000 COLONIES/mL PROTEUS MIRABILIS  Blood Culture ID Panel (Reflexed)     Status: Abnormal   Collection Time: 12/14/19  7:25 AM  Result Value Ref Range Status   Enterococcus species NOT DETECTED NOT DETECTED Final   Listeria monocytogenes NOT DETECTED NOT DETECTED Final   Staphylococcus species DETECTED (A) NOT DETECTED Final    Comment: Methicillin  (oxacillin) susceptible coagulase negative staphylococcus. Possible blood culture contaminant (unless isolated from more than one blood culture draw or clinical case suggests pathogenicity). No antibiotic treatment is indicated for blood  culture contaminants. CRITICAL RESULT CALLED TO, READ BACK BY AND VERIFIED WITH: J. GRIMSLEY,PHARMD 0518 12/15/2019 T. TYSOR    Staphylococcus aureus (BCID) NOT DETECTED NOT DETECTED Final   Methicillin resistance NOT DETECTED NOT DETECTED Final   Streptococcus species NOT DETECTED NOT DETECTED Final   Streptococcus agalactiae NOT DETECTED NOT DETECTED Final   Streptococcus pneumoniae NOT DETECTED NOT DETECTED Final   Streptococcus pyogenes NOT DETECTED NOT DETECTED Final   Acinetobacter baumannii NOT DETECTED NOT DETECTED Final   Enterobacteriaceae species DETECTED (A) NOT DETECTED Final    Comment: Enterobacteriaceae represent a large family of gram-negative bacteria, not a single organism. CRITICAL RESULT CALLED TO, READ BACK BY AND VERIFIED WITH: J. GRIMSLEY,PHARMD 0518 12/15/2019 T. TYSOR    Enterobacter cloacae complex NOT DETECTED NOT DETECTED Final   Escherichia coli NOT DETECTED NOT DETECTED Final   Klebsiella oxytoca NOT DETECTED NOT DETECTED Final   Klebsiella pneumoniae NOT DETECTED NOT DETECTED Final   Proteus species DETECTED (A) NOT DETECTED Final    Comment: CRITICAL RESULT CALLED TO, READ BACK BY AND VERIFIED WITH: J. GRIMSLEY,PHARMD 0518 12/15/2019 T. TYSOR    Serratia marcescens NOT DETECTED NOT DETECTED Final   Carbapenem resistance NOT DETECTED NOT DETECTED Final   Haemophilus influenzae NOT DETECTED NOT DETECTED Final   Neisseria meningitidis NOT DETECTED NOT DETECTED Final   Pseudomonas aeruginosa NOT DETECTED NOT DETECTED Final   Candida albicans NOT DETECTED NOT DETECTED Final   Candida glabrata NOT DETECTED NOT DETECTED Final   Candida krusei NOT DETECTED NOT DETECTED Final   Candida parapsilosis NOT DETECTED NOT DETECTED  Final   Candida tropicalis NOT DETECTED NOT DETECTED Final    Comment: Performed at [facility], [address] [place], [place] [address]  SARS Coronavirus 2 by RT PCR (hospital order, performed in [facility] hospital lab) Nasopharyngeal Nasopharyngeal Swab     Status: None   Collection Time: 12/14/19 11:02 AM   Specimen: Nasopharyngeal Swab  Result Value Ref Range Status   SARS  Coronavirus 2 NEGATIVE NEGATIVE Final    Comment: (NOTE) SARS-CoV-2 target nucleic acids are NOT DETECTED. The SARS-CoV-2 RNA is generally detectable in upper and lower respiratory specimens during the acute phase of infection. The lowest concentration of SARS-CoV-2 viral copies this assay can detect is 250 copies / mL. A negative result does not preclude SARS-CoV-2 infection and should not be used as the sole basis for treatment or other patient management decisions.  A negative result may occur with improper specimen collection / handling, submission of specimen other than nasopharyngeal swab, presence of viral mutation(s) within the areas targeted by this assay, and inadequate number of viral copies (<250 copies / mL). A negative result must be combined with clinical observations, patient history, and epidemiological information. Fact Sheet for Patients:   BoilerBrush.com.cyhttps://www.fda.gov/media/136312/download Fact Sheet for Healthcare Providers: https://pope.com/https://www.fda.gov/media/136313/download This test is not yet approved or cleared  by the [place] FDA and has been authorized for detection and/or diagnosis of SARS-CoV-2 by FDA under an Emergency Use Authorization (EUA).  This EUA will remain in effect (meaning this test can be used) for the duration of the COVID-19 declaration under Section 564(b)(1) of the Act, 21 U.S.C. section 360bbb-3(b)(1), unless the authorization is terminated or revoked sooner. Performed at [facility], [address] [place], [place] [address]   MRSA PCR  Screening     Status: None   Collection Time: 12/14/19  8:28 PM   Specimen: Nasal Mucosa; Nasopharyngeal  Result Value Ref Range Status   MRSA by PCR NEGATIVE NEGATIVE Final    Comment:        The GeneXpert MRSA Assay (FDA approved for NASAL specimens only), is one component of a comprehensive MRSA colonization surveillance program. It is not intended to diagnose MRSA infection nor to guide or monitor treatment for MRSA infections. Performed at [facility], [address] [place], [place] [address]       Radiology Studies: [facility]  Result Date: 12/17/2019 CLINICAL DATA:  CHF. Urosepsis. EXAM: PORTABLE CHEST 1 VIEW COMPARISON:  12/16/2019 FINDINGS: The cardiac silhouette remains upper limits of normal in size. Aortic atherosclerosis is noted. Hazy and patchy airspace and interstitial opacities throughout the right greater than left lungs are unchanged. There are likely small bilateral pleural effusions. No pneumothorax is identified. IMPRESSION: Unchanged right greater than left lung opacities which may reflect asymmetric edema or pneumonia. Electronically Signed   By: Sebastian AcheAllen  Grady M.D.   On: 12/17/2019 08:22     LOS: 4 days   Lanae Boastamesh Keaira Whitehurst, MD [facility]  12/18/2019, 2:14 PM

## 2019-12-18 NOTE — Progress Notes (Signed)
CRITICAL VALUE ALERT  Critical Value:  Plt 21  Date & Time Notied:  12/18/19@0506   Provider Notified: Yes  Orders Received/Actions taken: Yes

## 2019-12-18 NOTE — Care Management Important Message (Signed)
Important Message  Patient Details IM Letter given to Ezekiel Ina RN Case Manager to present to the Patient Name: Clifford Dennis MRN: [id number] Date of Birth: 05-12-1941   Medicare Important Message Given:  Yes     Caren Macadam 12/18/2019, 11:05 AM

## 2019-12-18 NOTE — Progress Notes (Addendum)
HEMATOLOGY-ONCOLOGY PROGRESS NOTE  SUBJECTIVE: More awake and alert this morning.  Has hand mitts on but wants them removed.  Nursing has not noted any bleeding.  Remains confused and oriented to person only.  REVIEW OF SYSTEMS:   No bleeding per nursing.  Unable to obtain comprehensive review of systems secondary to confusion.  I have reviewed the past medical history, past surgical history, social history and family history with the patient and they are unchanged from previous note.   PHYSICAL EXAMINATION:  Vitals:   12/17/19 2026 12/18/19 0402  BP: 140/82 (!) 151/74  Pulse: 76 68  Resp: 18   Temp: 98 F (36.7 C) 98 F (36.7 C)  SpO2: 98% 98%   Filed Weights   12/14/19 0819  Weight: 90.7 kg    Intake/Output from previous day: 05/26 0701 - 05/27 0700 In: 716.9 [I.V.:626.1; IV Piggyback:90.8] Out: 1100 [Urine:1100]  GENERAL: Awake and alert SKIN: Ecchymoses to bilateral arms, no petechiae LUNGS: clear to auscultation and percussion with normal breathing effort HEART: regular rate & rhythm and no murmurs and no lower extremity edema ABDOMEN:abdomen soft, non-tender and normal bowel sounds NEURO: Alert, oriented to person only  LABORATORY DATA:  I have reviewed the data as listed CMP Latest Ref Rng & Units 12/18/2019 12/17/2019 12/16/2019  Glucose 70 - 99 mg/dL 960(A) 540(J) 92  BUN 8 - 23 mg/dL 81(X) 91(Y) 20  Creatinine 0.61 - 1.24 mg/dL 7.82 9.56 2.13  Sodium 135 - 145 mmol/L 141 135 136  Potassium 3.5 - 5.1 mmol/L 3.2(L) 3.5 3.6  Chloride 98 - 111 mmol/L 103 102 105  CO2 22 - 32 mmol/L 23 24 18(L)  Calcium 8.9 - 10.3 mg/dL 0.8(M) 7.9(L) 7.6(L)  Total Protein 6.5 - 8.1 g/dL - - -  Total Bilirubin 0.3 - 1.2 mg/dL - - -  Alkaline Phos 38 - 126 U/L - - -  AST 15 - 41 U/L - - -  ALT 0 - 44 U/L - - -    Lab Results  Component Value Date   WBC 8.6 12/18/2019   HGB 13.0 12/18/2019   HCT 39.5 12/18/2019   MCV 86.8 12/18/2019   PLT 21 (LL) 12/18/2019   NEUTROABS  17.1 (H) 12/15/2019    CT Head Wo Contrast  Result Date: 12/12/2019 CLINICAL DATA:  79 year old male with multiple recent falls. Head injury. Dizziness and weakness. EXAM: CT HEAD WITHOUT CONTRAST CT CERVICAL SPINE WITHOUT CONTRAST TECHNIQUE: Multidetector CT imaging of the head and cervical spine was performed following the standard protocol without intravenous contrast. Multiplanar CT image reconstructions of the cervical spine were also generated. COMPARISON:  None. FINDINGS: CT HEAD FINDINGS Brain: Cerebral volume loss appears generalized with mild ex vacuo appearing ventricular enlargement. No evidence of transependymal edema. No midline shift, mass effect, evidence of mass lesion, intracranial hemorrhage or evidence of cortically based acute infarction. No cortical encephalomalacia identified. Mild to moderate bilateral cerebral white matter hypodensity. Vascular: Calcified atherosclerosis at the skull base. No suspicious intracranial vascular hyperdensity. Skull: Intact. Sinuses/Orbits: Visualized paranasal sinuses and mastoids are clear. Other: No acute orbit or scalp soft tissue findings. CT CERVICAL SPINE FINDINGS Alignment: Preserved lordosis. Subtle anterolisthesis at C6-C7 appears to be degenerative. Cervicothoracic junction alignment is within normal limits. Bilateral posterior element alignment is within normal limits. Skull base and vertebrae: Visualized skull base is intact. No atlanto-occipital dissociation. C1 and C2 appear intact and normally aligned. No acute osseous abnormality identified. Soft tissues and spinal canal: No prevertebral fluid or swelling. No  visible canal hematoma. Right greater than left cervical carotid calcified atherosclerosis. Otherwise negative noncontrast neck soft tissues. Disc levels: Widespread cervical facet arthropathy on the left. Mild for age disc and endplate degeneration. No cervical spinal stenosis suspected. Upper chest: Visible upper thoracic levels  appear intact. Negative lung apices. IMPRESSION: 1. No acute traumatic injury identified in the head or cervical spine. 2. Cerebral volume loss and mild to moderate for age cerebral white matter changes - most commonly due to chronic small vessel disease. 3. Left side facet arthropathy but otherwise mild for age cervical spine degeneration. Electronically Signed   By: Odessa Fleming M.D.   On: 12/12/2019 17:29   CT Cervical Spine Wo Contrast  Result Date: 12/12/2019 CLINICAL DATA:  79 year old male with multiple recent falls. Head injury. Dizziness and weakness. EXAM: CT HEAD WITHOUT CONTRAST CT CERVICAL SPINE WITHOUT CONTRAST TECHNIQUE: Multidetector CT imaging of the head and cervical spine was performed following the standard protocol without intravenous contrast. Multiplanar CT image reconstructions of the cervical spine were also generated. COMPARISON:  None. FINDINGS: CT HEAD FINDINGS Brain: Cerebral volume loss appears generalized with mild ex vacuo appearing ventricular enlargement. No evidence of transependymal edema. No midline shift, mass effect, evidence of mass lesion, intracranial hemorrhage or evidence of cortically based acute infarction. No cortical encephalomalacia identified. Mild to moderate bilateral cerebral white matter hypodensity. Vascular: Calcified atherosclerosis at the skull base. No suspicious intracranial vascular hyperdensity. Skull: Intact. Sinuses/Orbits: Visualized paranasal sinuses and mastoids are clear. Other: No acute orbit or scalp soft tissue findings. CT CERVICAL SPINE FINDINGS Alignment: Preserved lordosis. Subtle anterolisthesis at C6-C7 appears to be degenerative. Cervicothoracic junction alignment is within normal limits. Bilateral posterior element alignment is within normal limits. Skull base and vertebrae: Visualized skull base is intact. No atlanto-occipital dissociation. C1 and C2 appear intact and normally aligned. No acute osseous abnormality identified. Soft tissues  and spinal canal: No prevertebral fluid or swelling. No visible canal hematoma. Right greater than left cervical carotid calcified atherosclerosis. Otherwise negative noncontrast neck soft tissues. Disc levels: Widespread cervical facet arthropathy on the left. Mild for age disc and endplate degeneration. No cervical spinal stenosis suspected. Upper chest: Visible upper thoracic levels appear intact. Negative lung apices. IMPRESSION: 1. No acute traumatic injury identified in the head or cervical spine. 2. Cerebral volume loss and mild to moderate for age cerebral white matter changes - most commonly due to chronic small vessel disease. 3. Left side facet arthropathy but otherwise mild for age cervical spine degeneration. Electronically Signed   By: Odessa Fleming M.D.   On: 12/12/2019 17:29   CT Abdomen Pelvis W Contrast  Result Date: 12/14/2019 CLINICAL DATA:  Nausea vomiting. EXAM: CT ABDOMEN AND PELVIS WITH CONTRAST TECHNIQUE: Multidetector CT imaging of the abdomen and pelvis was performed using the standard protocol following bolus administration of intravenous contrast. CONTRAST:  OMNIPAQUE IOHEXOL 300 MG/ML  SOLN COMPARISON:  None. FINDINGS: Lower chest: Enlarged heart. Calcific atherosclerotic disease of the coronary arteries and aorta. Small hiatal hernia. Hepatobiliary: Normal appearance of the liver. The gallbladder is normally distended. No radiopaque gallstones are seen. However there is a small amount of pericholecystic fluid. The common bile duct is not dilated. Pancreas: Unremarkable. No pancreatic ductal dilatation or surrounding inflammatory changes. Spleen: Normal in size without focal abnormality. Adrenals/Urinary Tract: Normal adrenal glands. No evidence of hydronephrosis or nephrolithiasis. Bilateral perirenal fat stranding. Normal ureters and urinary bladder. Stomach/Bowel: Stomach is within normal limits. Appendix appears normal. No evidence of bowel wall distention,  or inflammatory  changes. Masslike thickening at the ileocecal junction may represent collapsed bowel, however true soft tissue mass cannot be excluded. Vascular/Lymphatic: Aortic atherosclerosis. No enlarged abdominal or pelvic lymph nodes. Reproductive: Slight enlargement of the prostate gland. Other: No abdominal wall hernia or abnormality. No abdominopelvic ascites. Musculoskeletal: Spondylosis of the lumbosacral spine. IMPRESSION: 1. No radiopaque gallstones or biliary ductal dilation is seen, however there is a small amount of pericholecystic fluid, which may be seen in early acute cholecystitis. 2. Bilateral perirenal fat stranding, nonspecific. Please correlate to urinalysis. 3. Masslike thickening at the ileocecal junction may represent collapsed bowel, however true soft tissue mass cannot be excluded. Please correlate to colonoscopy when clinically feasible. 4. Slight enlargement of the prostate gland. 5. Small hiatal hernia. Aortic Atherosclerosis (ICD10-I70.0). Electronically Signed   By: Ted Mcalpine M.D.   On: 12/14/2019 10:44   DG Chest Port 1 View  Result Date: 12/17/2019 CLINICAL DATA:  CHF. Urosepsis. EXAM: PORTABLE CHEST 1 VIEW COMPARISON:  12/16/2019 FINDINGS: The cardiac silhouette remains upper limits of normal in size. Aortic atherosclerosis is noted. Hazy and patchy airspace and interstitial opacities throughout the right greater than left lungs are unchanged. There are likely small bilateral pleural effusions. No pneumothorax is identified. IMPRESSION: Unchanged right greater than left lung opacities which may reflect asymmetric edema or pneumonia. Electronically Signed   By: Sebastian Ache M.D.   On: 12/17/2019 08:22   DG Chest Port 1 View  Result Date: 12/16/2019 CLINICAL DATA:  Urosepsis. Congestive heart failure. EXAM: PORTABLE CHEST 1 VIEW COMPARISON:  One-view chest x-ray 12/16/2019 8:01 a.m. FINDINGS: Heart size is upper limits of normal. Atherosclerotic calcifications are present in the  aorta. Asymmetric interstitial and airspace disease is worse on the right. There is no significant interval change. Effusions are suspected. IMPRESSION: 1. No significant interval change. 2. Asymmetric interstitial and airspace disease is worse on the right, compatible with asymmetric edema or infection. 3. Probable small pleural effusions. Electronically Signed   By: Marin Roberts M.D.   On: 12/16/2019 13:28   DG Chest Port 1 View  Result Date: 12/16/2019 CLINICAL DATA:  Hypoxia. EXAM: PORTABLE CHEST 1 VIEW COMPARISON:  Dec 14, 2019. FINDINGS: Stable cardiomediastinal silhouette. No pneumothorax or pleural effusion is noted. New large bilateral lung opacities are noted, right greater than left, consistent with multifocal pneumonia or possibly edema. Bony thorax is unremarkable. IMPRESSION: New large bilateral lung opacities are noted, right greater than left, consistent with multifocal pneumonia or possibly edema. Aortic Atherosclerosis (ICD10-I70.0). Electronically Signed   By: Lupita Raider M.D.   On: 12/16/2019 08:31   DG Chest Port 1 View  Result Date: 12/14/2019 CLINICAL DATA:  Fevers EXAM: PORTABLE CHEST 1 VIEW COMPARISON:  None. FINDINGS: Cardiac shadow is within normal limits. Aortic calcifications are seen. The lungs are clear. No bony abnormality is noted. IMPRESSION: No acute abnormality seen. Electronically Signed   By: Alcide Clever M.D.   On: 12/14/2019 08:10   DG Knee Complete 4 Views Right  Result Date: 12/12/2019 CLINICAL DATA:  Diffuse right knee pain following a fall. EXAM: RIGHT KNEE - COMPLETE 4+ VIEW COMPARISON:  None. FINDINGS: Mild to moderate medial joint space narrowing with associated minimal spur formation. Minimal lateral and patellofemoral spur formation. No fracture, dislocation or effusion. IMPRESSION: No fracture. Minimal degenerative changes. Electronically Signed   By: Beckie Salts M.D.   On: 12/12/2019 16:00   ECHOCARDIOGRAM COMPLETE  Result Date:  12/16/2019    ECHOCARDIOGRAM REPORT   Patient  Name:   Clifford Dennis Date of Exam: 12/16/2019 Medical Rec #:  [id number]     Height:       67.0 in Accession #:    [id number]    Weight:       200.0 lb Date of Birth:  1941-03-01     BSA:          2.022 m Patient Age:    61 years      BP:           117/70 mmHg Patient Gender: M             HR:           87 bpm. Exam Location:  Inpatient Procedure: 2D Echo, Cardiac Doppler and Color Doppler Indications:     CHF  History:         Patient has no prior history of Echocardiogram examinations.                  Signs/Symptoms:Altered Mental Status; Risk                  Factors:Hypertension, Diabetes and Dyslipidemia. Dementia,                  sepsis.  Sonographer:     Dustin Flock Referring Phys:  [id number] Hollis Hale Diagnosing Phys: Adrian Prows MD  Sonographer Comments: Altered mental status. IMPRESSIONS  1. Left ventricular ejection fraction, by estimation, is 60 to 65%. The left ventricle has normal function. The left ventricle has no regional wall motion abnormalities. Left ventricular diastolic parameters were normal.  2. Right ventricular systolic function is normal. The right ventricular size is normal. There is mildly elevated pulmonary artery systolic pressure.  3. The mitral valve is normal in structure. Mild mitral valve regurgitation. No evidence of mitral stenosis.  4. Node of Arentius (normal variant) noted on left coronary cusp). The aortic valve is normal in structure. Aortic valve regurgitation is not visualized. No aortic stenosis is present. FINDINGS  Left Ventricle: Left ventricular ejection fraction, by estimation, is 60 to 65%. The left ventricle has normal function. The left ventricle has no regional wall motion abnormalities. The left ventricular internal cavity size was normal in size. There is  no left ventricular hypertrophy. Left ventricular diastolic parameters were normal. Right Ventricle: The right ventricular size is normal. No increase in  right ventricular wall thickness. Right ventricular systolic function is normal. There is mildly elevated pulmonary artery systolic pressure. The tricuspid regurgitant velocity is 3.12  m/s, and with an assumed right atrial pressure of 3 mmHg, the estimated right ventricular systolic pressure is 30.8 mmHg. Left Atrium: Left atrial size was normal in size. Right Atrium: Right atrial size was normal in size. Pericardium: There is no evidence of pericardial effusion. Mitral Valve: The mitral valve is normal in structure. Normal mobility of the mitral valve leaflets. Mild mitral valve regurgitation. No evidence of mitral valve stenosis. Tricuspid Valve: The tricuspid valve is normal in structure. Tricuspid valve regurgitation is trivial. No evidence of tricuspid stenosis. Aortic Valve: Node of Arentius (normal variant) noted on left coronary cusp). The aortic valve is normal in structure. Aortic valve regurgitation is not visualized. Aortic regurgitation PHT measures 433 msec. No aortic stenosis is present. Pulmonic Valve: The pulmonic valve was normal in structure. Pulmonic valve regurgitation is trivial. No evidence of pulmonic stenosis. Aorta: The aortic root is normal in size and structure. IAS/Shunts: No atrial level shunt detected by color flow Doppler.  LEFT VENTRICLE PLAX 2D LVIDd:         4.50 cm  Diastology LVIDs:         3.40 cm  LV e' lateral:   8.49 cm/s LV PW:         1.00 cm  LV E/e' lateral: 8.7 LV IVS:        0.90 cm  LV e' medial:    9.25 cm/s LVOT diam:     2.20 cm  LV E/e' medial:  8.0 LV SV:         59 LV SV Index:   29 LVOT Area:     3.80 cm  RIGHT VENTRICLE RV Basal diam:  2.40 cm RV S prime:     18.50 cm/s TAPSE (M-mode): 2.9 cm LEFT ATRIUM             Index       RIGHT ATRIUM           Index LA diam:        3.60 cm 1.78 cm/m  RA Area:     17.30 cm LA Vol (A2C):   46.9 ml 23.19 ml/m RA Volume:   44.40 ml  21.96 ml/m LA Vol (A4C):   49.5 ml 24.48 ml/m LA Biplane Vol: 50.2 ml 24.82 ml/m   AORTIC VALVE LVOT Vmax:   92.70 cm/s LVOT Vmean:  66.000 cm/s LVOT VTI:    0.155 m AI PHT:      433 msec  AORTA Ao Root diam: 3.40 cm MITRAL VALVE               TRICUSPID VALVE MV Area (PHT): 3.99 cm    TR Peak grad:   38.9 mmHg MV Decel Time: 190 msec    TR Vmax:        312.00 cm/s MV E velocity: 74.10 cm/s MV A velocity: 81.00 cm/s  SHUNTS MV E/A ratio:  0.91        Systemic VTI:  0.16 m                            Systemic Diam: 2.20 cm Yates DecampJay Ganji MD Electronically signed by Yates DecampJay Ganji MD Signature Date/Time: 12/16/2019/12:30:31 PM    Final    US Abdomen Limited RUQ  Result Date: 12/15/2019 CLINICAL DATA:  Pericholecystic fluid on CT. EXAM: ULTRASOUND ABDOMEN LIMITED RIGHT UPPER QUADRANT COMPARISON:  CT abdomen pelvis from yesterday. FINDINGS: Gallbladder: No gallstones or wall thickening visualized. No pericholecystic fluid. No sonographic Murphy sign noted by sonographer. Common bile duct: Diameter: 3 mm, normal. Liver: No focal lesion identified. Within normal limits in parenchymal echogenicity. Portal vein is patent on color Doppler imaging with normal direction of blood flow towards the liver. Other: None. IMPRESSION: 1. Normal right upper quadrant ultrasound. Pericholecystic fluid has resolved. Electronically Signed   By: Obie DredgeWilliam T Derry M.D.   On: 12/15/2019 12:48    ASSESSMENT AND PLAN: 1.  Thrombocytopenia 2.  Severe sepsis/Urosepsis with Proteus bacteremia 3.  Acute hypoxic respiratory failure-?ARDS versus multifocal pneumonia versus asymmetric acute pulmonary edema/acute systolic CHF 4.  Elevated troponin 5.  AKI 6.  Hypertension 7.  Diabetes mellitus 8.  Acute metabolic encephalopathy 9.  Dementia/anxiety/depression 10.  Hyperlipidemia 11.  Mild leukocytosis, resolved  -Await HIT panel although this is thought to be less likely.  Suspect thrombocytopenia due to infection, urosepsis, and aspiration pneumonia. -CBC from today has been reviewed and platelet count is down to 21,000.  He  is not actively bleeding.  Will hold off on platelet transfusion at this time.  Recommend platelet transfusion if platelet count is less than 20,000 or active bleeding. -Continue antibiotics per hospitalist for urosepsis and pneumonia.   LOS: 4 days   Clifford Pare, DNP, AGPCNP-BC, AOCNP 12/18/19   Addendum  Pt is clinically stable, no new bleeding. He is confused, pulled his condom Foley. Lab reviewed, thrombocytopenia is worse.  We will continue monitoring closely, will hold platelet transfusion today.  Pt was seen by palliative care medicine Dr. Linna Darner today.  Patient was made DNR, hospice and comfort care was discussed if patient deteriorates in the next few days.  I agree with the plan.   Clifford Dennis  12/18/2019

## 2019-12-18 NOTE — Progress Notes (Signed)
Daily Progress Note   Patient Name: Clifford Dennis       Date: 12/18/2019 DOB: Dec 01, 1940  Age: 79 y.o. MRN#: [id number] Attending Physician: Lanae Boast, MD Primary Care Physician: System, Pcp Not In Admit Date: 12/14/2019  Reason for Consultation/Follow-up: Establishing goals of care  Subjective:  patient is confused, which is not far from his baseline. He asks for his car keys, so that he can drive himself home. He asks for his mittens to be removed.   I placed a call and discussed with his son Farzad Tibbetts this am, see below.   Length of Stay: 4  Current Medications: Scheduled Meds:  . allopurinol  300 mg Oral Daily  . atorvastatin  20 mg Oral QPM  . buPROPion  100 mg Oral Daily  . Chlorhexidine Gluconate Cloth  6 each Topical Daily  . escitalopram  10 mg Oral Daily  . insulin aspart  0-6 Units Subcutaneous Q6H  . pantoprazole (PROTONIX) IV  40 mg Intravenous Q24H  . sodium chloride flush  3 mL Intravenous Q12H  . valACYclovir  500 mg Oral Daily    Continuous Infusions: . lactated ringers 30 mL/hr at 12/17/19 0800  . lactated ringers 30 mL/hr at 12/18/19 0234  . piperacillin-tazobactam (ZOSYN)  IV 3.375 g (12/18/19 0410)    PRN Meds: acetaminophen **OR** acetaminophen, albuterol, hydrALAZINE, labetalol, OLANZapine  Physical Exam         Confused In mittens Has foley Shallow but clear breath sounds S1 S2 Abdomen not tender Has bruises petechiae in both UE and LE Awake but confused, answers some questions appropriately, is able to be re directed.   Vital Signs: BP (!) 151/74 (BP Location: Right Arm)   Pulse 68   Temp 98 F (36.7 C) (Oral)   Resp 18   Ht 5\' 7"  (1.702 m)   Wt 90.7 kg   SpO2 98%   BMI 31.32 kg/m  SpO2: SpO2: 98 % O2 Device: O2 Device: Nasal  Cannula O2 Flow Rate: O2 Flow Rate (L/min): 2 L/min  Intake/output summary:   Intake/Output Summary (Last 24 hours) at 12/18/2019 0820 Last data filed at 12/18/2019 0604 Gross per 24 hour  Intake 663.99 ml  Output 1100 ml  Net -436.01 ml   LBM: Last BM Date: 12/15/19 Baseline Weight: Weight: 90.7 kg Most recent weight:  Weight: 90.7 kg       Palliative Assessment/Data:      Patient Active Problem List   Diagnosis Date Noted  . Severe sepsis ([place]) 12/14/2019  . HTN (hypertension) 12/14/2019  . Dementia ([place]) 12/14/2019  . Impaired mobility and ADLs 12/14/2019  . AKI (acute kidney injury) ([place]) 12/14/2019    Palliative Care Assessment & Plan   Patient Profile:  Mr. Mousseau is a 79 year old male with a past medical history significant for hypertension, hyperlipidemia, dementia, and gout. The patient currently resides at [facility] assisted [facility] and was brought to the emergency room after recurrent fall. He was recently in the emergency room on 12/12/2019 following a fall as well, also striking his head.  Patient admitted to [facility] service for severe sepsis, urosepsis proteus bacteremia. He has acute hypoxic resp failure, possibly ARDS versus multi focal PNA. He has DM HLD HTN. Hospital course complicated by AKI, dysphagia, altered mental status.   A palliative consult has been requested for goals of care discussions.    Assessment:  confusion Severe infection Low platelets Dysphagia  Recommendations/Plan:  family meeting via phone call with son Masao Junker. I introduced myself and palliative care as follows: Palliative medicine is specialized medical care for people living with serious illness. It focuses on providing relief from the symptoms and stress of a serious illness. The goal is to improve quality of life for both the patient and the family.  Goals of care: Broad aims of medical therapy in relation to the patient's values and preferences. Our aim is to  provide medical care aimed at enabling patients to achieve the goals that matter most to them, given the circumstances of their particular medical situation and their constraints.   Brief life review performed: patient's wife and son died in the last year-15 months or so. He was moved from [place], [place] to [facility] just 4 weeks ago.   We discussed about the patient's underlying conditions as well as his current serious illnesses. We discussed about infection, dysphagia and low platelets. Discussed frankly but compassionately that the patient is undergoing multiple serious illnesses, and there exists a high likelihood of the patient not getting better as compared to any likelihood of meaningful recovery.   PLAN: Reviewed DNR DNI again with son and he concurs.  Continue current mode of care, as a time limited trial of interventions for the next 48 hours or so. If patient has some improvement, then ALF with hospice or SNF with palliative is recommended. If the patient is undergoing ongoing decline and increased symptom burden, then would recommend comfort measures and residential hospice. This has been discussed with patient's son Marya Amsler.    Code Status:    Code Status Orders  (From admission, onward)         Start     Ordered   12/14/19 1413  Do not attempt resuscitation (DNR)  Continuous    Question Answer Comment  In the event of cardiac or respiratory ARREST Do not call a "code blue"   In the event of cardiac or respiratory ARREST Do not perform Intubation, CPR, defibrillation or ACLS   In the event of cardiac or respiratory ARREST Use medication by any route, position, wound care, and other measures to relive pain and suffering. May use oxygen, suction and manual treatment of airway obstruction as needed for comfort.      12/14/19 1412        Code Status History    This patient  has a current code status but no historical code status.   Advance Care Planning Activity     Advance Directive Documentation     Most Recent Value  Type of Advance Directive  Living will  Pre-existing out of facility DNR order (yellow form or pink MOST form)  -  "MOST" Form in Place?  -       Prognosis:   guarded.   Discharge Planning:  To Be Determined  Care plan was discussed with  Patient, RN, son Tammy Sours on the phone.   Thank you for allowing the Palliative Medicine Team to assist in the care of this patient.   Time In: 7 Time Out: 7.35 Total Time 35 Prolonged Time Billed No.      Greater than 50%  of this time was spent counseling and coordinating care related to the above assessment and plan.  Rosalin Hawking, MD  Please contact Palliative Medicine Team phone at [phone] for questions and concerns.

## 2019-12-18 NOTE — Progress Notes (Signed)
Patient has a critical Platelet of  21. On call Jan, [place] made aware. Was working  remotely and unable to place an order. Verbal order given to d/c asa and plt transfusion.  Writer was not able to put an order for plt.  House supervisor tried and was not able to do so. Dr Dayna Barker, [facility] made notify via page this morning. Day shift nurse made aware.

## 2019-12-18 NOTE — Progress Notes (Signed)
  Speech Language Pathology Treatment: Dysphagia  Patient Details Name: Clifford Dennis MRN: [id number] DOB: [birth year] Today's Date: 12/18/2019 Time: 5573-2202 SLP Time Calculation (min) (ACUTE ONLY): 35 min  Assessment / Plan / Recommendation Clinical Impression  Much improved mentation today - pt fully alert and feeding himself.  He is disoriented due to his dementia and denies being in hospital for several days.  Oral care provided to clear viscous secretions - with pt provided hand over hand assist.  SLP set up oral suction.  Pt consumed entire popsicle, 4 ounces juice, a few bites of applesauce and 2 cracker boluses.  He declined further intake.  Clinical appearance of timely swallow and no overt indication of airway compromise.  Voice is strong and clear without wet gurgly quality.  Subtle throat clearing after one liquid swallow and subtle cough x1 after entire snack.    Recommend initiate dys3/thin diet for energy conservation - with his dysarthria - with set up assistance.  Hopeful for pt to continue to progress with medical improvement.    HPI HPI: 79 yo male adm to [facility] with AMS, n/v, found to have cholecystitis- urosepsis.  Pt also with h/o dementia, HTN, HLD, gout, falls.  Swallow evaluation ordered but pt attention has waxed/waned during hospital coarse. He has frequently either been lethargic or with mild agitation.  CXR negative for acute findings initially - cxr 5/26 showed Unchanged right greater than left lung opacities which may reflect asymmetric edema or pneumonia.      SLP Plan  Continue with current plan of care       Recommendations  Diet recommendations: Dysphagia 3 (mechanical soft);Thin liquid Liquids provided via: Cup;Straw Medication Administration: Whole meds with puree Supervision: Patient able to self feed Compensations: Slow rate;Small sips/bites Postural Changes and/or Swallow Maneuvers: Seated upright 90 degrees;Upright 30-60 min after meal                 Oral Care Recommendations: Oral care BID Follow up Recommendations: Skilled Nursing facility SLP Visit Diagnosis: Dysphagia, oropharyngeal phase (R13.12);Dysphagia, unspecified (R13.10) Plan: Continue with current plan of care       GO                Chales Abrahams 12/18/2019, 10:22 AM  Rolena Infante, MS [facility] SLP Acute Rehab Services Office [phone]

## 2019-12-18 NOTE — Evaluation (Signed)
Physical Therapy Evaluation Patient Details Name: Clifford Dennis MRN: [id number] DOB: [birth year] Today's Date: 12/18/2019   History of Present Illness  [name] with PMH HTN,HLD,dementia,gout from [facility] sent for recurrent fall.He was just in the ER on 5/21 after a fall, striking his head. The CT of his head revealed moderate cerebral atrophy but no acute bleed. Returned  5/25/21being very lethargic, vomiting and diarrhea, irritation with voiding, febrile,tachycardic 142,tachypneic 22, also became hypoxic on room air satting down into the 80s and was placed on 2 L [place].Patient was admitted for severe sepsis/lactic acidosis/UTI  Clinical Impression  The patient is very restless, trying to remove mitts. Patient repeating that he is not in the hospital and needs to get to his car. Reorientation is unsuccessful. Patient did mobilize with max assistance to sit up and stood x 2. Patient is from ALF. Facility will need to determine if able to return with max assist/fall risk. Pt admitted with above diagnosis.  Pt currently with functional limitations due to the deficits listed below (see PT Problem List). Pt will benefit from skilled PT to increase their independence and safety with mobility to allow discharge to the venue listed below.       Follow Up Recommendations SNF;Supervision/Assistance - 24 hour(unless ALF can take back)    Equipment Recommendations  None recommended by PT    Recommendations for Other Services       Precautions / Restrictions Precautions Precautions: Fall Precaution Comments: mitts in place, agitated      Mobility  Bed Mobility Overal bed mobility: Needs Assistance Bed Mobility: Supine to Sit;Sit to Supine     Supine to sit: Max assist Sit to supine: Max assist   General bed mobility comments: multimodal cues to move legs to bed edge, max assist with trunk, max assist to return to supine  Transfers Overall transfer level: Needs assistance Equipment  used: 1 person hand held assist Transfers: Sit to/from Stand Sit to Stand: Max assist         General transfer comment: assist to boost to stand x 2 holdiong onto both UE's. Attempted a side step but unable to perform safely  Ambulation/Gait                Stairs            Wheelchair Mobility    Modified Rankin (Stroke Patients Only)       Balance Overall balance assessment: Needs assistance;History of Falls Sitting-balance support: No upper extremity supported;Feet supported Sitting balance-Leahy Scale: Fair     Standing balance support: During functional activity;Bilateral upper extremity supported Standing balance-Leahy Scale: Poor Standing balance comment: requires suppoert                             Pertinent Vitals/Pain Pain Assessment: Faces Pain Score: 0-No pain    Home Living Family/patient expects to be discharged to:: Assisted living                 Additional Comments: unsure of PLOF,    Prior Function                 Hand Dominance        Extremity/Trunk Assessment   Upper Extremity Assessment Upper Extremity Assessment: Generalized weakness    Lower Extremity Assessment Lower Extremity Assessment: Generalized weakness    Cervical / Trunk Assessment Cervical / Trunk Assessment: Normal  Communication  Cognition Arousal/Alertness: Awake/alert Behavior During Therapy: Agitated;Restless Overall Cognitive Status: No family/caregiver present to determine baseline cognitive functioning Area of Impairment: Orientation;Attention;Memory;Following commands;Safety/judgement;Awareness                 Orientation Level: Place;Time;Situation Current Attention Level: Focused   Following Commands: Follows one step commands inconsistently   Awareness: Intellectual   General Comments: really does not answer, does not follow commands, very restless and responds to tactile cues. Repeating that he is not  in hospital and has to get to his car.      General Comments      Exercises     Assessment/Plan    PT Assessment Patient needs continued PT services  PT Problem List Decreased strength;Decreased balance;Decreased cognition;Decreased knowledge of precautions;Decreased mobility;Decreased activity tolerance;Decreased safety awareness       PT Treatment Interventions DME instruction;Therapeutic activities;Cognitive remediation;Gait training;Therapeutic exercise;Patient/family education;Functional mobility training    PT Goals (Current goals can be found in the Care Plan section)  Acute Rehab PT Goals PT Goal Formulation: Patient unable to participate in goal setting Time For Goal Achievement: 01/01/20 Potential to Achieve Goals: Fair    Frequency Min 2X/week   Barriers to discharge        Co-evaluation               AM-PAC PT "6 Clicks" Mobility  Outcome Measure Help needed turning from your back to your side while in a flat bed without using bedrails?: A Lot Help needed moving from lying on your back to sitting on the side of a flat bed without using bedrails?: A Lot Help needed moving to and from a bed to a chair (including a wheelchair)?: Total Help needed standing up from a chair using your arms (e.g., wheelchair or bedside chair)?: Total Help needed to walk in hospital room?: Total Help needed climbing 3-5 steps with a railing? : Total 6 Click Score: 8    End of Session   Activity Tolerance: Treatment limited secondary to agitation(trying to remove mitts) Patient left: in bed;with bed alarm set;with call bell/phone within reach Nurse Communication: Mobility status PT Visit Diagnosis: Unsteadiness on feet (R26.81);Repeated falls (R29.6);Difficulty in walking, not elsewhere classified (R26.2)    Time: 9201-0071 PT Time Calculation (min) (ACUTE ONLY): 19 min   Charges:   PT Evaluation $PT Eval Low Complexity: [facility] Acute  Rehabilitation Services Pager [phone] Office [phone]   Claretha Cooper 12/18/2019, 3:22 PM

## 2019-12-19 ENCOUNTER — Inpatient Hospital Stay (HOSPITAL_COMMUNITY): Payer: Medicare Other

## 2019-12-19 DIAGNOSIS — Z515 Encounter for palliative care: Secondary | ICD-10-CM

## 2019-12-19 DIAGNOSIS — R531 Weakness: Secondary | ICD-10-CM

## 2019-12-19 DIAGNOSIS — Z7189 Other specified counseling: Secondary | ICD-10-CM

## 2019-12-19 LAB — CBC
HCT: 38.3 % — ABNORMAL LOW (ref 39.0–52.0)
Hemoglobin: 13.1 g/dL (ref 13.0–17.0)
MCH: 28.7 pg (ref 26.0–34.0)
MCHC: 34.2 g/dL (ref 30.0–36.0)
MCV: 83.8 fL (ref 80.0–100.0)
Platelets: 11 10*3/uL — CL (ref 150–400)
RBC: 4.57 MIL/uL (ref 4.22–5.81)
RDW: 14.4 % (ref 11.5–15.5)
WBC: 4.5 10*3/uL (ref 4.0–10.5)
nRBC: 0 % (ref 0.0–0.2)

## 2019-12-19 LAB — GLUCOSE, CAPILLARY
Glucose-Capillary: 102 mg/dL — ABNORMAL HIGH (ref 70–99)
Glucose-Capillary: 109 mg/dL — ABNORMAL HIGH (ref 70–99)
Glucose-Capillary: 109 mg/dL — ABNORMAL HIGH (ref 70–99)
Glucose-Capillary: 115 mg/dL — ABNORMAL HIGH (ref 70–99)

## 2019-12-19 LAB — BASIC METABOLIC PANEL
Anion gap: 8 (ref 5–15)
BUN: 22 mg/dL (ref 8–23)
CO2: 26 mmol/L (ref 22–32)
Calcium: 7.2 mg/dL — ABNORMAL LOW (ref 8.9–10.3)
Chloride: 104 mmol/L (ref 98–111)
Creatinine, Ser: 0.87 mg/dL (ref 0.61–1.24)
GFR calc Af Amer: >60 mL/min (ref >60)
GFR calc non Af Amer: >60 mL/min (ref >60)
Glucose, Bld: 106 mg/dL — ABNORMAL HIGH (ref 70–99)
Potassium: 2.7 mmol/L — CL (ref 3.5–5.1)
Sodium: 138 mmol/L (ref 135–145)

## 2019-12-19 LAB — MAGNESIUM: Magnesium: 1.3 mg/dL — ABNORMAL LOW (ref 1.7–2.4)

## 2019-12-19 LAB — POTASSIUM: Potassium: 3.7 mmol/L (ref 3.5–5.1)

## 2019-12-19 LAB — BRAIN NATRIURETIC PEPTIDE: B Natriuretic Peptide: 322.1 pg/mL — ABNORMAL HIGH (ref 0.0–100.0)

## 2019-12-19 MED ORDER — POTASSIUM CHLORIDE 20 MEQ/15ML (10%) PO SOLN
40.0000 meq | ORAL | Status: AC
  Administered 2019-12-19 (×3): 40 meq via ORAL
  Filled 2019-12-19 (×3): qty 30

## 2019-12-19 NOTE — Progress Notes (Signed)
HEMATOLOGY-ONCOLOGY PROGRESS NOTE  SUBJECTIVE: Pt continue dropping, plt 11K this morning, no active bleeding. VS stable, pt is awake, alert, still confused. Not eating much.   REVIEW OF SYSTEMS:   No bleeding per nursing.  Unable to obtain comprehensive review of systems secondary to confusion.  I have reviewed the past medical history, past surgical history, social history and family history with the patient and they are unchanged from previous note.   PHYSICAL EXAMINATION:  Vitals:   12/19/19 1412 12/19/19 1959  BP: 122/63 134/66  Pulse: 81 79  Resp: 15 (!) 22  Temp: 98 F (36.7 C) 98.3 F (36.8 C)  SpO2: 93% 92%   Filed Weights   12/14/19 0819  Weight: 200 lb (90.7 kg)    Intake/Output from previous day: 05/27 0701 - 05/28 0700 In: 787.9 [P.O.:200; I.V.:360; IV Piggyback:227.9] Out: 800 [Urine:800]  GENERAL: Awake and alert SKIN: Ecchymoses to bilateral arms, no petechiae LUNGS: clear to auscultation and percussion with normal breathing effort HEART: regular rate & rhythm and no murmurs and no lower extremity edema ABDOMEN:abdomen soft, non-tender and normal bowel sounds NEURO: Alert, oriented to person only  LABORATORY DATA:  I have reviewed the data as listed CMP Latest Ref Rng & Units 12/19/2019 12/19/2019 12/18/2019  Glucose 70 - 99 mg/dL - 161(W) 960(A)  BUN 8 - 23 mg/dL - 22 54(U)  Creatinine 0.61 - 1.24 mg/dL - 9.81 1.91  Sodium 478 - 145 mmol/L - 138 141  Potassium 3.5 - 5.1 mmol/L 3.7 2.7(LL) 3.2(L)  Chloride 98 - 111 mmol/L - 104 103  CO2 22 - 32 mmol/L - 26 23  Calcium 8.9 - 10.3 mg/dL - 7.2(L) 8.4(L)  Total Protein 6.5 - 8.1 g/dL - - -  Total Bilirubin 0.3 - 1.2 mg/dL - - -  Alkaline Phos 38 - 126 U/L - - -  AST 15 - 41 U/L - - -  ALT 0 - 44 U/L - - -    Lab Results  Component Value Date   WBC 4.5 12/19/2019   HGB 13.1 12/19/2019   HCT 38.3 (L) 12/19/2019   MCV 83.8 12/19/2019   PLT 11 (LL) 12/19/2019   NEUTROABS 17.1 (H) 12/15/2019     CT Head Wo Contrast  Result Date: 12/12/2019 CLINICAL DATA:  79 year old male with multiple recent falls. Head injury. Dizziness and weakness. EXAM: CT HEAD WITHOUT CONTRAST CT CERVICAL SPINE WITHOUT CONTRAST TECHNIQUE: Multidetector CT imaging of the head and cervical spine was performed following the standard protocol without intravenous contrast. Multiplanar CT image reconstructions of the cervical spine were also generated. COMPARISON:  None. FINDINGS: CT HEAD FINDINGS Brain: Cerebral volume loss appears generalized with mild ex vacuo appearing ventricular enlargement. No evidence of transependymal edema. No midline shift, mass effect, evidence of mass lesion, intracranial hemorrhage or evidence of cortically based acute infarction. No cortical encephalomalacia identified. Mild to moderate bilateral cerebral white matter hypodensity. Vascular: Calcified atherosclerosis at the skull base. No suspicious intracranial vascular hyperdensity. Skull: Intact. Sinuses/Orbits: Visualized paranasal sinuses and mastoids are clear. Other: No acute orbit or scalp soft tissue findings. CT CERVICAL SPINE FINDINGS Alignment: Preserved lordosis. Subtle anterolisthesis at C6-C7 appears to be degenerative. Cervicothoracic junction alignment is within normal limits. Bilateral posterior element alignment is within normal limits. Skull base and vertebrae: Visualized skull base is intact. No atlanto-occipital dissociation. C1 and C2 appear intact and normally aligned. No acute osseous abnormality identified. Soft tissues and spinal canal: No prevertebral fluid or swelling. No visible canal hematoma. Right  greater than left cervical carotid calcified atherosclerosis. Otherwise negative noncontrast neck soft tissues. Disc levels: Widespread cervical facet arthropathy on the left. Mild for age disc and endplate degeneration. No cervical spinal stenosis suspected. Upper chest: Visible upper thoracic levels appear intact. Negative  lung apices. IMPRESSION: 1. No acute traumatic injury identified in the head or cervical spine. 2. Cerebral volume loss and mild to moderate for age cerebral white matter changes - most commonly due to chronic small vessel disease. 3. Left side facet arthropathy but otherwise mild for age cervical spine degeneration. Electronically Signed   By: Odessa Fleming M.D.   On: 12/12/2019 17:29   CT Cervical Spine Wo Contrast  Result Date: 12/12/2019 CLINICAL DATA:  79 year old male with multiple recent falls. Head injury. Dizziness and weakness. EXAM: CT HEAD WITHOUT CONTRAST CT CERVICAL SPINE WITHOUT CONTRAST TECHNIQUE: Multidetector CT imaging of the head and cervical spine was performed following the standard protocol without intravenous contrast. Multiplanar CT image reconstructions of the cervical spine were also generated. COMPARISON:  None. FINDINGS: CT HEAD FINDINGS Brain: Cerebral volume loss appears generalized with mild ex vacuo appearing ventricular enlargement. No evidence of transependymal edema. No midline shift, mass effect, evidence of mass lesion, intracranial hemorrhage or evidence of cortically based acute infarction. No cortical encephalomalacia identified. Mild to moderate bilateral cerebral white matter hypodensity. Vascular: Calcified atherosclerosis at the skull base. No suspicious intracranial vascular hyperdensity. Skull: Intact. Sinuses/Orbits: Visualized paranasal sinuses and mastoids are clear. Other: No acute orbit or scalp soft tissue findings. CT CERVICAL SPINE FINDINGS Alignment: Preserved lordosis. Subtle anterolisthesis at C6-C7 appears to be degenerative. Cervicothoracic junction alignment is within normal limits. Bilateral posterior element alignment is within normal limits. Skull base and vertebrae: Visualized skull base is intact. No atlanto-occipital dissociation. C1 and C2 appear intact and normally aligned. No acute osseous abnormality identified. Soft tissues and spinal canal: No  prevertebral fluid or swelling. No visible canal hematoma. Right greater than left cervical carotid calcified atherosclerosis. Otherwise negative noncontrast neck soft tissues. Disc levels: Widespread cervical facet arthropathy on the left. Mild for age disc and endplate degeneration. No cervical spinal stenosis suspected. Upper chest: Visible upper thoracic levels appear intact. Negative lung apices. IMPRESSION: 1. No acute traumatic injury identified in the head or cervical spine. 2. Cerebral volume loss and mild to moderate for age cerebral white matter changes - most commonly due to chronic small vessel disease. 3. Left side facet arthropathy but otherwise mild for age cervical spine degeneration. Electronically Signed   By: Odessa Fleming M.D.   On: 12/12/2019 17:29   CT Abdomen Pelvis W Contrast  Result Date: 12/14/2019 CLINICAL DATA:  Nausea vomiting. EXAM: CT ABDOMEN AND PELVIS WITH CONTRAST TECHNIQUE: Multidetector CT imaging of the abdomen and pelvis was performed using the standard protocol following bolus administration of intravenous contrast. CONTRAST:  OMNIPAQUE IOHEXOL 300 MG/ML  SOLN COMPARISON:  None. FINDINGS: Lower chest: Enlarged heart. Calcific atherosclerotic disease of the coronary arteries and aorta. Small hiatal hernia. Hepatobiliary: Normal appearance of the liver. The gallbladder is normally distended. No radiopaque gallstones are seen. However there is a small amount of pericholecystic fluid. The common bile duct is not dilated. Pancreas: Unremarkable. No pancreatic ductal dilatation or surrounding inflammatory changes. Spleen: Normal in size without focal abnormality. Adrenals/Urinary Tract: Normal adrenal glands. No evidence of hydronephrosis or nephrolithiasis. Bilateral perirenal fat stranding. Normal ureters and urinary bladder. Stomach/Bowel: Stomach is within normal limits. Appendix appears normal. No evidence of bowel wall distention, or inflammatory changes. Masslike  thickening at the ileocecal junction may represent collapsed bowel, however true soft tissue mass cannot be excluded. Vascular/Lymphatic: Aortic atherosclerosis. No enlarged abdominal or pelvic lymph nodes. Reproductive: Slight enlargement of the prostate gland. Other: No abdominal wall hernia or abnormality. No abdominopelvic ascites. Musculoskeletal: Spondylosis of the lumbosacral spine. IMPRESSION: 1. No radiopaque gallstones or biliary ductal dilation is seen, however there is a small amount of pericholecystic fluid, which may be seen in early acute cholecystitis. 2. Bilateral perirenal fat stranding, nonspecific. Please correlate to urinalysis. 3. Masslike thickening at the ileocecal junction may represent collapsed bowel, however true soft tissue mass cannot be excluded. Please correlate to colonoscopy when clinically feasible. 4. Slight enlargement of the prostate gland. 5. Small hiatal hernia. Aortic Atherosclerosis (ICD10-I70.0). Electronically Signed   By: Ted Mcalpineobrinka  Dimitrova M.D.   On: 12/14/2019 10:44   DG Chest Port 1 View  Result Date: 12/19/2019 CLINICAL DATA:  Congestive heart failure. EXAM: PORTABLE CHEST 1 VIEW COMPARISON:  One-view chest x-ray 12/17/2019 FINDINGS: Heart size is normal. Atherosclerotic calcifications are present at the aortic arch. Diffuse interstitial pattern is slightly improved, right greater than left. Small effusions remain, right greater than left. IMPRESSION: 1. Slight improvement in interstitial pattern, right greater than left. 2. Persistent small bilateral effusions, right greater than left. Electronically Signed   By: Marin Robertshristopher  Mattern M.D.   On: 12/19/2019 11:33   DG Chest Port 1 View  Result Date: 12/17/2019 CLINICAL DATA:  CHF. Urosepsis. EXAM: PORTABLE CHEST 1 VIEW COMPARISON:  12/16/2019 FINDINGS: The cardiac silhouette remains upper limits of normal in size. Aortic atherosclerosis is noted. Hazy and patchy airspace and interstitial opacities throughout  the right greater than left lungs are unchanged. There are likely small bilateral pleural effusions. No pneumothorax is identified. IMPRESSION: Unchanged right greater than left lung opacities which may reflect asymmetric edema or pneumonia. Electronically Signed   By: Sebastian AcheAllen  Grady M.D.   On: 12/17/2019 08:22   DG Chest Port 1 View  Result Date: 12/16/2019 CLINICAL DATA:  Urosepsis. Congestive heart failure. EXAM: PORTABLE CHEST 1 VIEW COMPARISON:  One-view chest x-ray 12/16/2019 8:01 a.m. FINDINGS: Heart size is upper limits of normal. Atherosclerotic calcifications are present in the aorta. Asymmetric interstitial and airspace disease is worse on the right. There is no significant interval change. Effusions are suspected. IMPRESSION: 1. No significant interval change. 2. Asymmetric interstitial and airspace disease is worse on the right, compatible with asymmetric edema or infection. 3. Probable small pleural effusions. Electronically Signed   By: Marin Robertshristopher  Mattern M.D.   On: 12/16/2019 13:28   DG Chest Port 1 View  Result Date: 12/16/2019 CLINICAL DATA:  Hypoxia. EXAM: PORTABLE CHEST 1 VIEW COMPARISON:  Dec 14, 2019. FINDINGS: Stable cardiomediastinal silhouette. No pneumothorax or pleural effusion is noted. New large bilateral lung opacities are noted, right greater than left, consistent with multifocal pneumonia or possibly edema. Bony thorax is unremarkable. IMPRESSION: New large bilateral lung opacities are noted, right greater than left, consistent with multifocal pneumonia or possibly edema. Aortic Atherosclerosis (ICD10-I70.0). Electronically Signed   By: Lupita RaiderJames  Green Jr M.D.   On: 12/16/2019 08:31   DG Chest Port 1 View  Result Date: 12/14/2019 CLINICAL DATA:  Fevers EXAM: PORTABLE CHEST 1 VIEW COMPARISON:  None. FINDINGS: Cardiac shadow is within normal limits. Aortic calcifications are seen. The lungs are clear. No bony abnormality is noted. IMPRESSION: No acute abnormality seen.  Electronically Signed   By: Alcide CleverMark  Lukens M.D.   On: 12/14/2019 08:10   DG Knee Complete  4 Views Right  Result Date: 12/12/2019 CLINICAL DATA:  Diffuse right knee pain following a fall. EXAM: RIGHT KNEE - COMPLETE 4+ VIEW COMPARISON:  None. FINDINGS: Mild to moderate medial joint space narrowing with associated minimal spur formation. Minimal lateral and patellofemoral spur formation. No fracture, dislocation or effusion. IMPRESSION: No fracture. Minimal degenerative changes. Electronically Signed   By: Beckie Salts M.D.   On: 12/12/2019 16:00   ECHOCARDIOGRAM COMPLETE  Result Date: 12/16/2019    ECHOCARDIOGRAM REPORT   Patient Name:   Clifford Dennis Date of Exam: 12/16/2019 Medical Rec #:  [id number]     Height:       67.0 in Accession #:    [id number]    Weight:       200.0 lb Date of Birth:  21-Jun-1941     BSA:          2.022 m Patient Age:    78 years      BP:           117/70 mmHg Patient Gender: M             HR:           87 bpm. Exam Location:  Inpatient Procedure: 2D Echo, Cardiac Doppler and Color Doppler Indications:     CHF  History:         Patient has no prior history of Echocardiogram examinations.                  Signs/Symptoms:Altered Mental Status; Risk                  Factors:Hypertension, Diabetes and Dyslipidemia. Dementia,                  sepsis.  Sonographer:     Lavenia Atlas Referring Phys:  [id number] RAMESH KC Diagnosing Phys: Yates Decamp MD  Sonographer Comments: Altered mental status. IMPRESSIONS  1. Left ventricular ejection fraction, by estimation, is 60 to 65%. The left ventricle has normal function. The left ventricle has no regional wall motion abnormalities. Left ventricular diastolic parameters were normal.  2. Right ventricular systolic function is normal. The right ventricular size is normal. There is mildly elevated pulmonary artery systolic pressure.  3. The mitral valve is normal in structure. Mild mitral valve regurgitation. No evidence of mitral stenosis.  4. Node of  Arentius (normal variant) noted on left coronary cusp). The aortic valve is normal in structure. Aortic valve regurgitation is not visualized. No aortic stenosis is present. FINDINGS  Left Ventricle: Left ventricular ejection fraction, by estimation, is 60 to 65%. The left ventricle has normal function. The left ventricle has no regional wall motion abnormalities. The left ventricular internal cavity size was normal in size. There is  no left ventricular hypertrophy. Left ventricular diastolic parameters were normal. Right Ventricle: The right ventricular size is normal. No increase in right ventricular wall thickness. Right ventricular systolic function is normal. There is mildly elevated pulmonary artery systolic pressure. The tricuspid regurgitant velocity is 3.12  m/s, and with an assumed right atrial pressure of 3 mmHg, the estimated right ventricular systolic pressure is 41.9 mmHg. Left Atrium: Left atrial size was normal in size. Right Atrium: Right atrial size was normal in size. Pericardium: There is no evidence of pericardial effusion. Mitral Valve: The mitral valve is normal in structure. Normal mobility of the mitral valve leaflets. Mild mitral valve regurgitation. No evidence of mitral valve stenosis. Tricuspid Valve: The tricuspid valve is normal  in structure. Tricuspid valve regurgitation is trivial. No evidence of tricuspid stenosis. Aortic Valve: Node of Arentius (normal variant) noted on left coronary cusp). The aortic valve is normal in structure. Aortic valve regurgitation is not visualized. Aortic regurgitation PHT measures 433 msec. No aortic stenosis is present. Pulmonic Valve: The pulmonic valve was normal in structure. Pulmonic valve regurgitation is trivial. No evidence of pulmonic stenosis. Aorta: The aortic root is normal in size and structure. IAS/Shunts: No atrial level shunt detected by color flow Doppler.  LEFT VENTRICLE PLAX 2D LVIDd:         4.50 cm  Diastology LVIDs:         3.40  cm  LV e' lateral:   8.49 cm/s LV PW:         1.00 cm  LV E/e' lateral: 8.7 LV IVS:        0.90 cm  LV e' medial:    9.25 cm/s LVOT diam:     2.20 cm  LV E/e' medial:  8.0 LV SV:         59 LV SV Index:   29 LVOT Area:     3.80 cm  RIGHT VENTRICLE RV Basal diam:  2.40 cm RV S prime:     18.50 cm/s TAPSE (M-mode): 2.9 cm LEFT ATRIUM             Index       RIGHT ATRIUM           Index LA diam:        3.60 cm 1.78 cm/m  RA Area:     17.30 cm LA Vol (A2C):   46.9 ml 23.19 ml/m RA Volume:   44.40 ml  21.96 ml/m LA Vol (A4C):   49.5 ml 24.48 ml/m LA Biplane Vol: 50.2 ml 24.82 ml/m  AORTIC VALVE LVOT Vmax:   92.70 cm/s LVOT Vmean:  66.000 cm/s LVOT VTI:    0.155 m AI PHT:      433 msec  AORTA Ao Root diam: 3.40 cm MITRAL VALVE               TRICUSPID VALVE MV Area (PHT): 3.99 cm    TR Peak grad:   38.9 mmHg MV Decel Time: 190 msec    TR Vmax:        312.00 cm/s MV E velocity: 74.10 cm/s MV A velocity: 81.00 cm/s  SHUNTS MV E/A ratio:  0.91        Systemic VTI:  0.16 m                            Systemic Diam: 2.20 cm Yates Decamp MD Electronically signed by Yates Decamp MD Signature Date/Time: 12/16/2019/12:30:31 PM    Final    US Abdomen Limited RUQ  Result Date: 12/15/2019 CLINICAL DATA:  Pericholecystic fluid on CT. EXAM: ULTRASOUND ABDOMEN LIMITED RIGHT UPPER QUADRANT COMPARISON:  CT abdomen pelvis from yesterday. FINDINGS: Gallbladder: No gallstones or wall thickening visualized. No pericholecystic fluid. No sonographic Murphy sign noted by sonographer. Common bile duct: Diameter: 3 mm, normal. Liver: No focal lesion identified. Within normal limits in parenchymal echogenicity. Portal vein is patent on color Doppler imaging with normal direction of blood flow towards the liver. Other: None. IMPRESSION: 1. Normal right upper quadrant ultrasound. Pericholecystic fluid has resolved. Electronically Signed   By: Obie Dredge M.D.   On: 12/15/2019 12:48    ASSESSMENT AND PLAN: 1.  Thrombocytopenia,  secondary to  infection and sepsis  2.  Severe sepsis/Urosepsis with Proteus bacteremia 3.  Acute hypoxic respiratory failure-?ARDS versus multifocal pneumonia versus asymmetric acute pulmonary edema/acute systolic CHF 4.  Elevated troponin 5.  AKI 6.  Hypertension 7.  Diabetes mellitus 8.  Acute metabolic encephalopathy 9.  Dementia/anxiety/depression 10.  Hyperlipidemia 11.  Mild leukocytosis, resolved  -HIT antibody came back negative, HIT is very unlikely  -his thrombocytopenia is likely related to his infection and sepsis. -consider plt transfusion if plt<10K or active bleeding -he continue to declining, likely transition to hospice  -please call my on-call partner if needed over the weekend. I will be back next Wednesday    LOS: 5 days   Truitt Merle 12/19/19

## 2019-12-19 NOTE — Progress Notes (Signed)
Pharmacy Antibiotic Note  Clifford Dennis is a 79 y.o. male admitted on 12/14/2019 with pneumonia.  Pharmacy has been consulted for zosyn dosing - broaden ceftriaxone to zosyn to cover aspiration PNA as well as proteus in blood and urine cx, also covers CNS in blood (?contaminant). CXR: New large bilateral lung opacities are noted, right greater than left, consistent with multifocal pneumonia or possibly edema.  Day #7 Abx currently Zosyn - Afebrile - WBC down 4.5 - SCr improved 0.87, CrCl 75 ml/min - PCT 27.8 >> 12.48  Plan: Continue Zosyn 3.375g IV q8h (4 hour infusion).  Plan noted to possibly narrow to Unasyn  Height: 5\' 7"  (170.2 cm) Weight: 90.7 kg (200 lb) IBW/kg (Calculated) : 66.1  Temp (24hrs), Avg:97.8 F (36.6 C), Min:97.5 F (36.4 C), Max:98.1 F (36.7 C)  Recent Labs  Lab 12/14/19 0725 12/14/19 1638 12/14/19 2055 12/15/19 0030 12/15/19 0435 12/15/19 0435 12/16/19 0246 12/16/19 0715 12/16/19 0850 12/17/19 0222 12/18/19 0448 12/19/19 0433  WBC   < >  --   --   --  18.6*  --  14.7*  --   --  10.7* 8.6 4.5  CREATININE   < >  --   --   --  1.48*   < > 1.17 1.09  --  1.10 1.05 0.87  LATICACIDVEN  --  2.4* 3.3* 3.3* 2.7*  --   --   --  1.8  --   --   --    < > = values in this interval not displayed.    Estimated Creatinine Clearance: 75.1 mL/min (by C-G formula based on SCr of 0.87 mg/dL).    No Known Allergies  Antimicrobials this admission: 5/23 Cefepime >> 5/24 5/23 Flagyl >> 5/24 5/23 Vancomycin>> 5/24 5/24 Ceftriaxone >>5/25 5/25 Zosyn EI>>  Dose adjustments this admission:   Microbiology results: 5/23 BCx: Proteus- sens to all X NTF/Meth sens CoNS 5/21 UCx NGF 5/23 UCx:  > 100 K proteus: R NTF, sens to all others 5/23 MRSA neg  Thank you for allowing pharmacy to be a part of this patient's care.  6/23, PharmD, BCPS Pharmacy: [phone] 12/19/2019 10:03 AM

## 2019-12-19 NOTE — Progress Notes (Signed)
SLP Cancellation Note  Patient Details Name: Clifford Dennis MRN: [id number] DOB: 09/15/1940   Cancelled treatment:       Reason Eval/Treat Not Completed: Other (comment);Fatigue/lethargy limiting ability to participate(pt reportedly up all night and with agitation this am,  will continue efforts)   Chales Abrahams 12/19/2019, 10:09 AM Rolena Infante, MS [facility] SLP Acute Rehab Services Office [phone]

## 2019-12-19 NOTE — Progress Notes (Signed)
Daily Progress Note   Patient Name: Clifford Dennis       Date: 12/19/2019 DOB: [birth year]  Age: 79 y.o. MRN#: [id number] Attending Physician: Lanae Boast, MD Primary Care Physician: System, Pcp Not In Admit Date: 12/14/2019  Reason for Consultation/Follow-up: Establishing goals of care  Subjective:  patient is confused, his mittens have been removed. He appears chronically ill, doesn't respond or interact much today.    Length of Stay: 5  Current Medications: Scheduled Meds:  . allopurinol  300 mg Oral Daily  . atorvastatin  20 mg Oral QPM  . buPROPion  100 mg Oral Daily  . Chlorhexidine Gluconate Cloth  6 each Topical Daily  . escitalopram  10 mg Oral Daily  . insulin aspart  0-6 Units Subcutaneous Q6H  . pantoprazole (PROTONIX) IV  40 mg Intravenous Q24H  . potassium chloride  40 mEq Oral Q4H  . sodium chloride flush  3 mL Intravenous Q12H  . valACYclovir  500 mg Oral Daily    Continuous Infusions: . lactated ringers 30 mL/hr at 12/17/19 0800  . lactated ringers 30 mL/hr at 12/18/19 1909  . piperacillin-tazobactam (ZOSYN)  IV 3.375 g (12/19/19 1220)    PRN Meds: acetaminophen **OR** acetaminophen, albuterol, ALPRAZolam, hydrALAZINE, labetalol, OLANZapine  Physical Exam         Confused Has foley Shallow but clear breath sounds S1 S2 Abdomen not tender Has bruises petechiae in both UE and LE Awake but confused  Vital Signs: BP 130/74 (BP Location: Left Leg)   Pulse 64   Temp (!) 97.5 F (36.4 C) (Oral)   Resp 16   Ht 5\' 7"  (1.702 m)   Wt 90.7 kg   SpO2 96%   BMI 31.32 kg/m  SpO2: SpO2: 96 % O2 Device: O2 Device: Room Air O2 Flow Rate: O2 Flow Rate (L/min): 2 L/min  Intake/output summary:   Intake/Output Summary (Last 24 hours) at 12/19/2019 1319 Last data  filed at 12/19/2019 0856 Gross per 24 hour  Intake 467.93 ml  Output 400 ml  Net 67.93 ml   LBM: Last BM Date: 12/19/19 Baseline Weight: Weight: 90.7 kg Most recent weight: Weight: 90.7 kg       Palliative Assessment/Data:      Patient Active Problem List   Diagnosis Date Noted  . Severe sepsis ([facility]) 12/14/2019  .  HTN (hypertension) 12/14/2019  . Dementia ([place]) 12/14/2019  . Impaired mobility and ADLs 12/14/2019  . AKI (acute kidney injury) ([place]) 12/14/2019    Palliative Care Assessment & Plan   Patient Profile:  Mr. Heffley is a 79 year old male with a past medical history significant for hypertension, hyperlipidemia, dementia, and gout. The patient currently resides at [facility] and was brought to the emergency room after recurrent fall. He was recently in the emergency room on 12/12/2019 following a fall as well, also striking his head.  Patient admitted to [facility] service for severe sepsis, urosepsis proteus bacteremia. He has acute hypoxic resp failure, possibly ARDS versus multi focal PNA. He has DM HLD HTN. Hospital course complicated by AKI, dysphagia, altered mental status.   A palliative consult has been requested for goals of care discussions.    Assessment:  confusion Severe infection Low platelets Dysphagia  Recommendations/Plan:  ongoing decline Continues with current mode of care Monitor hospital course Will recommend transition to comfort care, residential hospice if patient has ongoing decline. Still with poor to nil PO intake, ongoing serious illness of infection and low platelets.   PMT to follow.     Code Status:    Code Status Orders  (From admission, onward)         Start     Ordered   12/14/19 1413  Do not attempt resuscitation (DNR)  Continuous    Question Answer Comment  In the event of cardiac or respiratory ARREST Do not call a "code blue"   In the event of cardiac or respiratory ARREST Do not perform  Intubation, CPR, defibrillation or ACLS   In the event of cardiac or respiratory ARREST Use medication by any route, position, wound care, and other measures to relive pain and suffering. May use oxygen, suction and manual treatment of airway obstruction as needed for comfort.      12/14/19 1412        Code Status History    This patient has a current code status but no historical code status.   Advance Care Planning Activity    Advance Directive Documentation     Most Recent Value  Type of Advance Directive  Living will  Pre-existing out of facility DNR order (yellow form or pink MOST form)  -  "MOST" Form in Place?  -       Prognosis:   guarded.   Discharge Planning:  To Be Determined  Care plan was discussed with  Patient  Thank you for allowing the Palliative Medicine Team to assist in the care of this patient.   Time In: 1300 Time Out: 1320 Total Time 20 Prolonged Time Billed No.      Greater than 50%  of this time was spent counseling and coordinating care related to the above assessment and plan.  Loistine Chance, MD  Please contact Palliative Medicine Team phone at [phone] for questions and concerns.

## 2019-12-19 NOTE — Progress Notes (Signed)
PROGRESS NOTE    Ector Laurel  HGD:[id number] DOB: Jul 21, 1941 DOA: 12/14/2019 PCP: System, Pcp Not In   Chef Complaints: Fall and Fever  Brief Narrative: [name] with PMH HTN,HLD,dementia,gout from [facility] sent for recurrent fall.He was just in the ER on 5/21 after a fall as well, also striking his head. The CT of his head revealed moderate cerebral atrophy but no acute bleed.  CT of his C-spine also was negative for acute injuries.  X-ray of right knee also negative.  On 5/23 he stated that he hit his right arm but did not fall to the ground nor strike his head.  He is a difficult historian and most of HPI is obtained from chart review. On 12/12/2019 it was reported that he was having dizziness and weakness more so on the left side since Saturday of last week.  There is reported history of decreased oral intake.Urinalysis and lab work-up were relatively unremarkable and after imaging studies, he was back to his mental baseline and he was discharged back to his ALF. He did require a straight catheterization due to difficulty voiding however prior to being discharged back. In ED:he was very lethargic, there is report of some vomiting and diarrhea  with vague abdominal pain.He is also endorsing some irritation with voiding. He was febrile,101.8,tachycardic 142,tachypneic 22, and initial BP 162/78 however this slowly down trended to 109/65 , also became hypoxic on room air satting down into the 80s and was placed on 2 L [place].Notable labs include: Lactic acid 4.8,BUN 16,creatinine 1.43. WBC 3.2,94% neutrophils.UA, large Hgb, 100 protein,positive nitrites, moderate leukocyte esterase, greater than 50 WBC. In the ER:he was given 3-4 L NS BS. Patient was admitted for severe sepsis/lactic acidosis/UTI. Blood culture and urine culture grew  Proteus. 5/25- respiratory distress, with uncontrolled hypertension and 200: 60 still bilateral opacities pulmonary edema versus multifocal pneumonia given 60 of IV  Lasix and steroids and had significant improvement with good urine output and seen by cardiology.  Subjective: Overnight some agitation pending Zyprexa/sedatives.  This morning resting. Able to wake up for pain Remains afebrile doing well on room air. Potassium was very low this morning 2.7 and replacement was ordered.  Assessment & Plan:  Severe sepsis due to Proteus bacteremia from Proteus UTI: Hemodynamically stable. Chest x-ray 5/26 showed unchanged right greater than left opacities- multifocal pneumonia versus asymmetric edema. Coag neg staph bacteremia- likely contamination.continue Zosyn ( started on 5/25) to cover aspiration/CAP and bacteremia.Procalcitonin downtrending, afebrile and WBC count stable. Repeat CXR today hopefull descalate to unasyn soon. Mild peri-cholecyttic fluid in the CT scan which is resolved on the right upper quadrant ultrasound.  monitor.   Proteus UTI -abx as above.  Acute metabolic encephalopathy due to sepsis in the setting of dementia with very poor short-term memory.  Continue to provide supportive care fall precaution.  Continue as needed Zyprexa.  Monitor EKG in the morning- ordered EKG- if QTC stable we can consider Seroquel at bedtime . SLP eval  Done started DYS 3 diet. Resume home bupropion/Celexa, Xanax, Neurontin.   Acute hypoxic respiratory failure with respiratory distress : Chest x-ray multiple focal pneumonia versus asymmetric edema improved with Lasix x2.  Currently doing well on room air.  Right greater than left lung opacities: Multifocal pneumonia versus asymmetric pulmonary edema Repeat chest x-ray today.  Patient remains on empiric Zosyn.  Patient is not able to cough oxygen after Lasix x2 doses-suspecting acute pulmonary edema from uncontrolled htn and iv hydration. Off ivf. Echocardiogram  was obtained overall stable.  Repeat chest x-ray 1-2 days.   Positive troponins form demand mismatch:trops flat. Seen by cardiology.    Severe  hypokalemia repleting aggressively today.  Follow-up BMP.  Hypomagnesemia- repleted   NSVT- stable Lactic acidosis/metabolic acidosis. Lactate improved. AKI: resolved. Suspect from sepsis/volume depletion. Hold Home  lisinopril Metformin on Hold.  Blood pressure fairly stable now.  Home lisinopril on hold.  Continue as needed IV meds   Diabetes mellitus type 2: A1c stable 6.6. sugar controlled, cont to monitor and cont ssi  Coagulopathy INR 1.4.Likely from sepsis.  Monitor.  Severe thrombocytopenia: Platelet count continues to drop, HIT Ab is negative.  Continue to monitor appreciate hematology input transfuse if bleeding or if less than 10,000    Recent Labs  Lab 12/16/19 0246 12/17/19 0222 12/17/19 1149 12/18/19 0448 12/19/19 0433  PLT 90* 40* 36* 21* 11*   Impaired mobility and ADLs:Moved to ALF 3 wks ago and has declined rapidly as per son.He stopped eating since moving, PT/OT eval. patient has had frequent falls at ALF. Pt/ot   Hyperlipidemia resume statins   GOC: DNR, palliative following- if improves return to facility with hospice  DVT prophylaxis:SCD. Heparin SQ on hold for thrombocytopenia. Code Status: DNR Family Communication: plan of care discussed with patient.  Discussed with palliative care, oncology team.  I had updated son previously.   Status is: Inpatient Remains inpatient appropriate because:Altered mental status, IV treatments appropriate due to intensity of illness or inability to take PO, Inpatient level of care appropriate due to severity of illness and For ongoing treatment of sepsis, bacteremia  Dispo: The patient is from: Home              Anticipated d/c is to: TBD              Anticipated d/c date is: 2 days              Patient currently is not medically stable to d/c.  Nutrition- NPO until SLP eval Diet Order            DIET DYS 3 Room service appropriate? Yes; Fluid consistency: Thin  Diet effective now              Body mass index  is 31.32 kg/m.  Consultants:see note  Procedures:  CT Abdomen 1.No radiopaque gallstones or biliary ductal dilation is seen, however there is a small amount of pericholecystic fluid, which may be seen in early acute cholecystitis. 2. Bilateral perirenal fat stranding, nonspecific. Please correlate to urinalysis. 3. Masslike thickening at the ileocecal junction may represent collapsed bowel, however true soft tissue mass cannot be excluded. Please correlate to colonoscopy when clinically feasible. 4. Slight enlargement of the prostate gland. 5. Small hiatal hernia.  TTE 1. Left ventricular ejection fraction, by estimation, is 60 to 65%. The  left ventricle has normal function. The left ventricle has no regional  wall motion abnormalities. Left ventricular diastolic parameters were  normal.  2. Right ventricular systolic function is normal. The right ventricular  size is normal. There is mildly elevated pulmonary artery systolic  pressure.  3. The mitral valve is normal in structure. Mild mitral valve  regurgitation. No evidence of mitral stenosis.  4. Node of Arentius (normal variant) noted on left coronary cusp). The  aortic valve is normal in structure. Aortic valve regurgitation is not  visualized. No aortic stenosis is present.   Microbiology:see note  Medications: Scheduled Meds: . allopurinol  300 mg Oral  Daily  . atorvastatin  20 mg Oral QPM  . buPROPion  100 mg Oral Daily  . Chlorhexidine Gluconate Cloth  6 each Topical Daily  . escitalopram  10 mg Oral Daily  . insulin aspart  0-6 Units Subcutaneous Q6H  . pantoprazole (PROTONIX) IV  40 mg Intravenous Q24H  . potassium chloride  40 mEq Oral Q4H  . sodium chloride flush  3 mL Intravenous Q12H  . valACYclovir  500 mg Oral Daily   Continuous Infusions: . lactated ringers 30 mL/hr at 12/17/19 0800  . lactated ringers 30 mL/hr at 12/18/19 1909  . piperacillin-tazobactam (ZOSYN)  IV 3.375 g (12/19/19 0405)     Antimicrobials: Anti-infectives (From admission, onward)   Start     Dose/Rate Route Frequency Ordered Stop   12/16/19 1200  piperacillin-tazobactam (ZOSYN) IVPB 3.375 g     3.375 g 12.5 mL/hr over 240 Minutes Intravenous Every 8 hours 12/16/19 1035     12/15/19 1300  vancomycin (VANCOCIN) IVPB 1000 mg/200 mL premix  Status:  Discontinued     1,000 mg 200 mL/hr over 60 Minutes Intravenous Every 24 hours 12/14/19 1237 12/15/19 0526   12/15/19 0600  cefTRIAXone (ROCEPHIN) 2 g in sodium chloride 0.9 % 100 mL IVPB  Status:  Discontinued     2 g 200 mL/hr over 30 Minutes Intravenous Every 24 hours 12/15/19 0527 12/16/19 1035   12/14/19 2000  ceFEPIme (MAXIPIME) 2 g in sodium chloride 0.9 % 100 mL IVPB  Status:  Discontinued     2 g 200 mL/hr over 30 Minutes Intravenous Every 12 hours 12/14/19 1210 12/15/19 0526   12/14/19 1600  metroNIDAZOLE (FLAGYL) IVPB 500 mg  Status:  Discontinued     500 mg 100 mL/hr over 60 Minutes Intravenous Every 8 hours 12/14/19 1210 12/15/19 0526   12/14/19 1415  valACYclovir (VALTREX) tablet 500 mg     500 mg Oral Daily 12/14/19 1412     12/14/19 1230  vancomycin (VANCOREADY) IVPB 2000 mg/400 mL     2,000 mg 200 mL/hr over 120 Minutes Intravenous  Once 12/14/19 1223 12/14/19 1655   12/14/19 0830  ceFEPIme (MAXIPIME) 2 g in sodium chloride 0.9 % 100 mL IVPB     2 g 200 mL/hr over 30 Minutes Intravenous  Once 12/14/19 0815 12/14/19 1117   12/14/19 0830  metroNIDAZOLE (FLAGYL) IVPB 500 mg     500 mg 100 mL/hr over 60 Minutes Intravenous  Once 12/14/19 0815 12/14/19 1117    Objective: Vitals: Today's Vitals   12/18/19 2015 12/19/19 0011 12/19/19 0644 12/19/19 0838  BP: 133/75  129/69 130/74  Pulse: 73  68 64  Resp:   16   Temp: 98.1 F (36.7 C)  97.6 F (36.4 C) (!) 97.5 F (36.4 C)  TempSrc: Oral   Oral  SpO2: 95%   96%  Weight:      Height:      PainSc:  0-No pain  Asleep    Intake/Output Summary (Last 24 hours) at 12/19/2019 1021 Last data  filed at 12/18/2019 1800 Gross per 24 hour  Intake 667.93 ml  Output 800 ml  Net -132.07 ml   Filed Weights   12/14/19 0819  Weight: 90.7 kg   Weight change:    Intake/Output from previous day: 05/27 0701 - 05/28 0700 In: 787.9 [P.O.:200; I.V.:360; IV Piggyback:227.9] Out: 800 [Urine:800] Intake/Output this shift: No intake/output data recorded.  Examination: General exam: Lethargic but able to respond to pain, on room air, not in  distress.   HEENT:Oral mucosa moist, Ear/Nose WNL grossly, dentition normal. Respiratory system: bilaterally diminished breath sound,no wheezing or crackles,no use of accessory muscle Cardiovascular system: S1 & S2 +, No JVD,. Gastrointestinal system: Abdomen soft, NT,ND, BS+ Nervous System: Responds to pain, lethargic.   Extremities: No edema, distal peripheral pulses palpable.  Skin: No rashes,no icterus. MSK: Normal muscle bulk,tone, power  Data Reviewed: I have personally reviewed following labs and imaging studies CBC: Recent Labs  Lab 12/12/19 1602 12/12/19 1602 12/14/19 0725 12/14/19 0725 12/15/19 0435 12/15/19 0435 12/16/19 0246 12/17/19 0222 12/17/19 1149 12/18/19 0448 12/19/19 0433  WBC 9.1   < > 3.2*   < > 18.6*  --  14.7* 10.7*  --  8.6 4.5  NEUTROABS 7.1  --  2.9  --  17.1*  --   --   --   --   --   --   HGB 12.8*   < > 13.7   < > 11.8*  --  11.9* 12.5*  --  13.0 13.1  HCT 39.3   < > 42.8   < > 36.4*  --  35.4* 37.9*  --  39.5 38.3*  MCV 86.8   < > 87.9   < > 88.8  --  85.5 87.3  --  86.8 83.8  PLT 228   < > 246   < > 144*   < > 90* 40* 36* 21* 11*   < > = values in this interval not displayed.   Basic Metabolic Panel: Recent Labs  Lab 12/15/19 0435 12/15/19 0435 12/16/19 0246 12/16/19 0715 12/17/19 0222 12/18/19 0448 12/19/19 0433  NA 135   < > 136 136 135 141 138  K 3.9   < > 3.4* 3.6 3.5 3.2* 2.7*  CL 105   < > 107 105 102 103 104  CO2 19*   < > 19* 18* 24 23 26   GLUCOSE 91   < > 87 92 159* 110* 106*  BUN 20    < > 21 20 27* 29* 22  CREATININE 1.48*   < > 1.17 1.09 1.10 1.05 0.87  CALCIUM 7.5*   < > 7.7* 7.6* 7.9* 8.4* 7.2*  MG 0.7*  --   --  1.3* 1.9  --   --    < > = values in this interval not displayed.   GFR: Estimated Creatinine Clearance: 75.1 mL/min (by C-G formula based on SCr of 0.87 mg/dL). Liver Function Tests: Recent Labs  Lab 12/12/19 1602 12/14/19 0725  AST 21 25  ALT 16 17  ALKPHOS 79 109  BILITOT 0.9 1.5*  PROT 6.6 6.7  ALBUMIN 4.0 4.0   Recent Labs  Lab 12/14/19 0725  LIPASE 39   No results for input(s): AMMONIA in the last 168 hours. Coagulation Profile: Recent Labs  Lab 12/14/19 0725 12/15/19 0435 12/17/19 1149  INR 1.2 1.4* 0.9   Cardiac Enzymes: No results for input(s): CKTOTAL, CKMB, CKMBINDEX, TROPONINI in the last 168 hours. BNP (last 3 results) No results for input(s): PROBNP in the last 8760 hours. HbA1C: No results for input(s): HGBA1C in the last 72 hours. CBG: Recent Labs  Lab 12/18/19 0030 12/18/19 0721 12/18/19 1133 12/18/19 1706 12/19/19 0807  GLUCAP 126* 98 149* 115* 102*   Lipid Profile: No results for input(s): CHOL, HDL, LDLCALC, TRIG, CHOLHDL, LDLDIRECT in the last 72 hours. Thyroid Function Tests: No results for input(s): TSH, T4TOTAL, FREET4, T3FREE, THYROIDAB in the last 72 hours. Anemia Panel: No results  for input(s): VITAMINB12, FOLATE, FERRITIN, TIBC, IRON, RETICCTPCT in the last 72 hours. Sepsis Labs: Recent Labs  Lab 12/14/19 2055 12/15/19 0030 12/15/19 0435 12/16/19 0850 12/17/19 0222 12/18/19 0448  PROCALCITON  --   --  97.64  --  27.80 12.48  LATICACIDVEN 3.3* 3.3* 2.7* 1.8  --   --     Recent Results (from the past 240 hour(s))  Urine Culture     Status: None   Collection Time: 12/12/19  3:36 PM   Specimen: Urine, Random  Result Value Ref Range Status   Specimen Description   Final    URINE, RANDOM Performed at [facility], [place] [address] [place], [place] [address]     Special Requests   Final    NONE Performed at [facility], [place] [address] [place], [place] [address]    Culture   Final    NO GROWTH Performed at [facility], [place] [address], [place], [place] [address]    Report Status 12/14/2019 FINAL  Final  Blood Culture (routine x 2)     Status: Abnormal   Collection Time: 12/14/19  7:25 AM   Specimen: BLOOD  Result Value Ref Range Status   Specimen Description   Final    BLOOD LEFT ANTECUBITAL Performed at [place] [address] [place], [place] [address]    Special Requests   Final    BOTTLES DRAWN AEROBIC ONLY Blood Culture adequate volume Performed at [place] [address], [place], [place] [address]    Culture  Setup Time   Final    GRAM NEGATIVE RODS GRAM POSITIVE COCCI IN CLUSTERS AEROBIC BOTTLE ONLY CRITICAL RESULT CALLED TO, READ BACK BY AND VERIFIED WITH: J. GRIMSLEY,PHARMD 0518 12/15/2019 T. TYSOR    Culture (A)  Final    PROTEUS MIRABILIS STAPHYLOCOCCUS SPECIES (COAGULASE NEGATIVE) THE SIGNIFICANCE OF ISOLATING THIS ORGANISM FROM A SINGLE SET OF BLOOD CULTURES WHEN MULTIPLE SETS ARE DRAWN IS UNCERTAIN. PLEASE NOTIFY THE MICROBIOLOGY DEPARTMENT WITHIN ONE WEEK IF SPECIATION AND SENSITIVITIES ARE REQUIRED. Performed at [facility], [place] [address], [place], [place] [address]    Report Status 12/17/2019 FINAL  Final   Organism ID, Bacteria PROTEUS MIRABILIS  Final      Susceptibility   Proteus mirabilis - MIC*    AMPICILLIN <=2 SENSITIVE Sensitive     CEFAZOLIN <=4 SENSITIVE Sensitive     CEFEPIME <=1 SENSITIVE Sensitive     CEFTAZIDIME <=1 SENSITIVE Sensitive     CEFTRIAXONE <=1 SENSITIVE Sensitive     CIPROFLOXACIN <=0.25 SENSITIVE Sensitive     GENTAMICIN <=1 SENSITIVE Sensitive     IMIPENEM 2 SENSITIVE Sensitive     TRIMETH/SULFA <=20 SENSITIVE Sensitive     AMPICILLIN/SULBACTAM <=2 SENSITIVE Sensitive     PIP/TAZO <=4 SENSITIVE  Sensitive     * PROTEUS MIRABILIS  Blood Culture (routine x 2)     Status: Abnormal   Collection Time: 12/14/19  7:25 AM   Specimen: BLOOD  Result Value Ref Range Status   Specimen Description   Final    BLOOD BLOOD RIGHT FOREARM Performed at [place] [address], [place], [place] [address]    Special Requests   Final    BOTTLES DRAWN AEROBIC AND ANAEROBIC Blood Culture adequate volume Performed at [place] [address], [place], [place] [address]    Culture  Setup Time   Final    Lonell Grandchild  NEGATIVE RODS CRITICAL VALUE NOTED.  VALUE IS CONSISTENT WITH PREVIOUSLY REPORTED AND CALLED VALUE. IN BOTH AEROBIC AND ANAEROBIC BOTTLES    Culture (A)  Final    PROTEUS MIRABILIS SUSCEPTIBILITIES PERFORMED ON PREVIOUS CULTURE WITHIN THE LAST 5 DAYS. Performed at [facility], [address] [place], [place] [address]    Report Status 12/17/2019 FINAL  Final  Urine culture     Status: Abnormal   Collection Time: 12/14/19  7:25 AM   Specimen: In/Out Cath Urine  Result Value Ref Range Status   Specimen Description   Final    IN/OUT CATH URINE Performed at [facility], [address], [place], [place] [address]    Special Requests   Final    NONE Performed at [facility], [address], [place], [place] [address]    Culture >=100,000 COLONIES/mL PROTEUS MIRABILIS (A)  Final   Report Status 12/16/2019 FINAL  Final   Organism ID, Bacteria PROTEUS MIRABILIS (A)  Final      Susceptibility   Proteus mirabilis - MIC*    AMPICILLIN <=2 SENSITIVE Sensitive     CEFAZOLIN <=4 SENSITIVE Sensitive     CEFTRIAXONE <=1 SENSITIVE Sensitive     CIPROFLOXACIN <=0.25 SENSITIVE Sensitive     GENTAMICIN <=1 SENSITIVE Sensitive     IMIPENEM 1 SENSITIVE Sensitive     NITROFURANTOIN 128 RESISTANT Resistant     TRIMETH/SULFA <=20 SENSITIVE Sensitive     AMPICILLIN/SULBACTAM <=2 SENSITIVE Sensitive     PIP/TAZO  <=4 SENSITIVE Sensitive     * >=100,000 COLONIES/mL PROTEUS MIRABILIS  Blood Culture ID Panel (Reflexed)     Status: Abnormal   Collection Time: 12/14/19  7:25 AM  Result Value Ref Range Status   Enterococcus species NOT DETECTED NOT DETECTED Final   Listeria monocytogenes NOT DETECTED NOT DETECTED Final   Staphylococcus species DETECTED (A) NOT DETECTED Final    Comment: Methicillin (oxacillin) susceptible coagulase negative staphylococcus. Possible blood culture contaminant (unless isolated from more than one blood culture draw or clinical case suggests pathogenicity). No antibiotic treatment is indicated for blood  culture contaminants. CRITICAL RESULT CALLED TO, READ BACK BY AND VERIFIED WITH: J. GRIMSLEY,PHARMD 0518 12/15/2019 T. TYSOR    Staphylococcus aureus (BCID) NOT DETECTED NOT DETECTED Final   Methicillin resistance NOT DETECTED NOT DETECTED Final   Streptococcus species NOT DETECTED NOT DETECTED Final   Streptococcus agalactiae NOT DETECTED NOT DETECTED Final   Streptococcus pneumoniae NOT DETECTED NOT DETECTED Final   Streptococcus pyogenes NOT DETECTED NOT DETECTED Final   Acinetobacter baumannii NOT DETECTED NOT DETECTED Final   Enterobacteriaceae species DETECTED (A) NOT DETECTED Final    Comment: Enterobacteriaceae represent a large family of gram-negative bacteria, not a single organism. CRITICAL RESULT CALLED TO, READ BACK BY AND VERIFIED WITH: J. GRIMSLEY,PHARMD 0518 12/15/2019 T. TYSOR    Enterobacter cloacae complex NOT DETECTED NOT DETECTED Final   Escherichia coli NOT DETECTED NOT DETECTED Final   Klebsiella oxytoca NOT DETECTED NOT DETECTED Final   Klebsiella pneumoniae NOT DETECTED NOT DETECTED Final   Proteus species DETECTED (A) NOT DETECTED Final    Comment: CRITICAL RESULT CALLED TO, READ BACK BY AND VERIFIED WITH: J. GRIMSLEY,PHARMD 0518 12/15/2019 T. TYSOR    Serratia marcescens NOT DETECTED NOT DETECTED Final   Carbapenem resistance NOT DETECTED NOT  DETECTED Final   Haemophilus influenzae NOT DETECTED NOT DETECTED Final   Neisseria meningitidis NOT DETECTED NOT DETECTED Final   Pseudomonas aeruginosa NOT DETECTED NOT  DETECTED Final   Candida albicans NOT DETECTED NOT DETECTED Final   Candida glabrata NOT DETECTED NOT DETECTED Final   Candida krusei NOT DETECTED NOT DETECTED Final   Candida parapsilosis NOT DETECTED NOT DETECTED Final   Candida tropicalis NOT DETECTED NOT DETECTED Final    Comment: Performed at [facility], [address], [place], [place] [address]  SARS Coronavirus 2 by RT PCR (hospital order, performed in [facility] hospital lab) Nasopharyngeal Nasopharyngeal Swab     Status: None   Collection Time: 12/14/19 11:02 AM   Specimen: Nasopharyngeal Swab  Result Value Ref Range Status   SARS Coronavirus 2 NEGATIVE NEGATIVE Final    Comment: (NOTE) SARS-CoV-2 target nucleic acids are NOT DETECTED. The SARS-CoV-2 RNA is generally detectable in upper and lower respiratory specimens during the acute phase of infection. The lowest concentration of SARS-CoV-2 viral copies this assay can detect is 250 copies / mL. A negative result does not preclude SARS-CoV-2 infection and should not be used as the sole basis for treatment or other patient management decisions.  A negative result may occur with improper specimen collection / handling, submission of specimen other than nasopharyngeal swab, presence of viral mutation(s) within the areas targeted by this assay, and inadequate number of viral copies (<250 copies / mL). A negative result must be combined with clinical observations, patient history, and epidemiological information. Fact Sheet for Patients:   BoilerBrush.com.cy Fact Sheet for Healthcare Providers: https://pope.com/ This test is not yet approved or cleared  by the [place] FDA and has been authorized for detection and/or diagnosis of SARS-CoV-2 by FDA  under an Emergency Use Authorization (EUA).  This EUA will remain in effect (meaning this test can be used) for the duration of the COVID-19 declaration under Section 564(b)(1) of the Act, 21 U.S.C. section 360bbb-3(b)(1), unless the authorization is terminated or revoked sooner. Performed at [facility], [address] [place], [place] [address]   MRSA PCR Screening     Status: None   Collection Time: 12/14/19  8:28 PM   Specimen: Nasal Mucosa; Nasopharyngeal  Result Value Ref Range Status   MRSA by PCR NEGATIVE NEGATIVE Final    Comment:        The GeneXpert MRSA Assay (FDA approved for NASAL specimens only), is one component of a comprehensive MRSA colonization surveillance program. It is not intended to diagnose MRSA infection nor to guide or monitor treatment for MRSA infections. Performed at [facility], [address], [place], [place] [address]       Radiology Studies: No results found.   LOS: 5 days   Lanae Boast, MD [facility]  12/19/2019, 10:21 AM

## 2019-12-19 NOTE — Progress Notes (Signed)
Critical Lab  Potassium: 2.7 Made provider aware.

## 2019-12-20 DIAGNOSIS — Z79899 Other long term (current) drug therapy: Secondary | ICD-10-CM

## 2019-12-20 DIAGNOSIS — R7989 Other specified abnormal findings of blood chemistry: Secondary | ICD-10-CM

## 2019-12-20 DIAGNOSIS — D689 Coagulation defect, unspecified: Secondary | ICD-10-CM

## 2019-12-20 DIAGNOSIS — E119 Type 2 diabetes mellitus without complications: Secondary | ICD-10-CM

## 2019-12-20 DIAGNOSIS — E876 Hypokalemia: Secondary | ICD-10-CM

## 2019-12-20 DIAGNOSIS — R918 Other nonspecific abnormal finding of lung field: Secondary | ICD-10-CM

## 2019-12-20 DIAGNOSIS — R269 Unspecified abnormalities of gait and mobility: Secondary | ICD-10-CM

## 2019-12-20 DIAGNOSIS — E785 Hyperlipidemia, unspecified: Secondary | ICD-10-CM

## 2019-12-20 DIAGNOSIS — N390 Urinary tract infection, site not specified: Secondary | ICD-10-CM

## 2019-12-20 LAB — CBC
HCT: 38.4 % — ABNORMAL LOW (ref 39.0–52.0)
Hemoglobin: 12.5 g/dL — ABNORMAL LOW (ref 13.0–17.0)
MCH: 28.3 pg (ref 26.0–34.0)
MCHC: 32.6 g/dL (ref 30.0–36.0)
MCV: 86.9 fL (ref 80.0–100.0)
Platelets: 18 10*3/uL — CL (ref 150–400)
RBC: 4.42 MIL/uL (ref 4.22–5.81)
RDW: 14.3 % (ref 11.5–15.5)
WBC: 5.6 10*3/uL (ref 4.0–10.5)
nRBC: 0 % (ref 0.0–0.2)

## 2019-12-20 LAB — BASIC METABOLIC PANEL
Anion gap: 9 (ref 5–15)
BUN: 21 mg/dL (ref 8–23)
CO2: 25 mmol/L (ref 22–32)
Calcium: 7.8 mg/dL — ABNORMAL LOW (ref 8.9–10.3)
Chloride: 100 mmol/L (ref 98–111)
Creatinine, Ser: 0.94 mg/dL (ref 0.61–1.24)
GFR calc Af Amer: >60 mL/min (ref >60)
GFR calc non Af Amer: >60 mL/min (ref >60)
Glucose, Bld: 121 mg/dL — ABNORMAL HIGH (ref 70–99)
Potassium: 3 mmol/L — ABNORMAL LOW (ref 3.5–5.1)
Sodium: 134 mmol/L — ABNORMAL LOW (ref 135–145)

## 2019-12-20 LAB — GLUCOSE, CAPILLARY
Glucose-Capillary: 119 mg/dL — ABNORMAL HIGH (ref 70–99)
Glucose-Capillary: 99 mg/dL (ref 70–99)
Glucose-Capillary: 119 mg/dL — ABNORMAL HIGH (ref 70–99)

## 2019-12-20 LAB — BRAIN NATRIURETIC PEPTIDE: B Natriuretic Peptide: 125.9 pg/mL — ABNORMAL HIGH (ref 0.0–100.0)

## 2019-12-20 MED ORDER — MAGNESIUM CHLORIDE 64 MG PO TBEC
2.0000 | DELAYED_RELEASE_TABLET | Freq: Every day | ORAL | Status: DC
  Administered 2019-12-20 – 2019-12-21 (×2): 128 mg via ORAL
  Filled 2019-12-20 (×2): qty 2

## 2019-12-20 MED ORDER — POTASSIUM CHLORIDE 20 MEQ/15ML (10%) PO SOLN
40.0000 meq | Freq: Three times a day (TID) | ORAL | Status: AC
  Administered 2019-12-20 – 2019-12-21 (×3): 40 meq via ORAL
  Filled 2019-12-20 (×2): qty 30

## 2019-12-20 NOTE — Progress Notes (Signed)
Patient ID: Clifford Dennis, male   DOB: Nov 03, 1940, 79 y.o.   MRN: [id number]  PROGRESS NOTE    Hung Rhinesmith  EXN:[id number] DOB: 10/03/1940 DOA: 12/14/2019 PCP: System, Pcp Not In    Brief Narrative:  [name] with PMH HTN,HLD,dementia,gout from [facility] sent for recurrent fall. He was just in the ER on 5/21 after a fall as well, also striking his head. The CT of his head revealed moderate cerebral atrophy but no acute bleed. CT of his C-spine also was negative for acute injuries. X-ray of right knee also negative.  On 5/23 he stated that he hit his right arm but did not fall to the ground nor strike his head. He is a difficult historian and most of HPI is obtained from chart review. On 12/12/2019 it was reported that he was having dizziness and weakness more so on the left side since Saturday of last week.  There is reported history of decreased oral intake.Urinalysis and lab work-up were relatively unremarkable and after imaging studies, he was back to his mental baseline and he was discharged back to his ALF. He did require a straight catheterization due to difficulty voiding however prior to being discharged back. In ED:he was very lethargic, there is report of some vomiting and diarrhea  with vague abdominal pain.He is also endorsing some irritation with voiding. He was febrile,101.8,tachycardic 142,tachypneic 22, and initial BP 162/78 however this slowly down trended to 109/65 , also became hypoxic on room air satting down into the 80s and was placed on 2 L [place].Notable labs include: Lactic acid 4.8,BUN 16,creatinine 1.43. WBC 3.2,94% neutrophils.UA, large Hgb, 100 protein,positive nitrites, moderate leukocyte esterase, greater than 50 WBC. In the ER:he was given 3-4 L NS BS. Patient was admitted for severe sepsis/lactic acidosis/UTI. Blood culture and urine culture grew  Proteus. 5/25- respiratory distress, with uncontrolled hypertension and 200: 60 still bilateral opacities pulmonary  edema versus multifocal pneumonia given 60 of IV Lasix and steroids and had significant improvement with good urine output and seen by cardiology.   Assessment & Plan:   Principal Problem:   Severe sepsis (HCC) Active Problems:   HTN (hypertension)   Dementia (HCC)   Impaired mobility and ADLs   AKI (acute kidney injury) (HCC)   Palliative care by specialist   Goals of care, counseling/discussion   General weakness  Severe sepsis due to Proteus bacteremia from Proteus UTI: Hemodynamically stable. Chest x-ray 5/26 showed unchanged right greater than left opacities- multifocal pneumonia versus asymmetric edema. Coag neg staphbacteremia- likely contamination.continueZosyn ( started on 5/25)to cover aspiration/CAP and bacteremia.Procalcitonin downtrending, afebrile and WBC count stable. Repeat CXR today hopefull descalate to unasyn soon. Mild peri-cholecystic fluid in the CT scan which is resolved on the right upper quadrant ultrasound.monitor.  Proteus UTI-abx as above.  Acute metabolic encephalopathy due to sepsis in the setting of dementia with very poor short-term memory.  Continue to provide supportive care fall precautions. Continue as needed Zyprexa.  Monitor EKG in the morning- ordered EKG- if QTC stable we can consider Seroquel at bedtime . SLP eval  Done started DYS 3 diet. Resume home bupropion/Celexa, Xanax, Neurontin.  Acute hypoxic respiratory failure with respiratory distress : Chest x-ray multiple focal pneumonia versus asymmetric edema improved with Lasix x2.  Currently doing well on room air.  Right greater than left lung opacities: Multifocal pneumonia versus asymmetric pulmonary edema Repeat chest x-ray today.  Patient remains on empiric Zosyn.  Patient is not able to cough oxygen after  Lasix x2 doses-suspecting acute pulmonary edema from uncontrolled htn and iv hydration. Off ivf. Echocardiogram was obtained overall stable.  Repeat chest x-ray 1-2 days.  Positive  troponins from demand mismatch:trops flat.Seen by cardiology.   Severe hypokalemia repleting with IVF, aspirated on KCL. Follow-up BMP.  Hypomagnesemia-repleted   NSVT- stable  Lactic acidosis/metabolic acidosis. Lactate improved.  AKI: resolved. Suspect from sepsis/volume depletion.Hold Home lisinopril Metformin on Hold.  Blood pressure fairly stable now.  Home lisinopril on hold.  Continue as needed IV meds   Diabetes mellitus type 2:A1c stable 6.6. sugar controlled, cont to monitor and cont ssi  Coagulopathy INR 1.4.Likely from sepsis. Monitor.  Severe thrombocytopenia: Platelet count continues to drop, HIT Ab is negative.  Continue to monitor appreciate hematology input transfuse if bleeding or if less than 10,000    Impaired mobility and ADLs:Moved to ALF 3 wks ago and has declined rapidly as per son.He stopped eating since moving, PT/OT eval. patient has had frequent falls at ALF. Pt/ot  Hyperlipidemia on statin  GOC: DNR, palliative following-will seek Hospice care given decline vs. SNF with continued frequent infections.  DVT prophylaxis: SCD/Compression stockings Code Status: DNR  Family Communication: Family at bedside Disposition Plan: Hospice, Palliative care   Consultants:   Heme/Onc  Palliative care  Procedures:  None  Antimicrobials: Anti-infectives (From admission, onward)   Start     Dose/Rate Route Frequency Ordered Stop   12/16/19 1200  piperacillin-tazobactam (ZOSYN) IVPB 3.375 g     3.375 g 12.5 mL/hr over 240 Minutes Intravenous Every 8 hours 12/16/19 1035     12/15/19 1300  vancomycin (VANCOCIN) IVPB 1000 mg/200 mL premix  Status:  Discontinued     1,000 mg 200 mL/hr over 60 Minutes Intravenous Every 24 hours 12/14/19 1237 12/15/19 0526   12/15/19 0600  cefTRIAXone (ROCEPHIN) 2 g in sodium chloride 0.9 % 100 mL IVPB  Status:  Discontinued     2 g 200 mL/hr over 30 Minutes Intravenous Every 24 hours 12/15/19 0527 12/16/19 1035     12/14/19 2000  ceFEPIme (MAXIPIME) 2 g in sodium chloride 0.9 % 100 mL IVPB  Status:  Discontinued     2 g 200 mL/hr over 30 Minutes Intravenous Every 12 hours 12/14/19 1210 12/15/19 0526   12/14/19 1600  metroNIDAZOLE (FLAGYL) IVPB 500 mg  Status:  Discontinued     500 mg 100 mL/hr over 60 Minutes Intravenous Every 8 hours 12/14/19 1210 12/15/19 0526   12/14/19 1415  valACYclovir (VALTREX) tablet 500 mg     500 mg Oral Daily 12/14/19 1412     12/14/19 1230  vancomycin (VANCOREADY) IVPB 2000 mg/400 mL     2,000 mg 200 mL/hr over 120 Minutes Intravenous  Once 12/14/19 1223 12/14/19 1655   12/14/19 0830  ceFEPIme (MAXIPIME) 2 g in sodium chloride 0.9 % 100 mL IVPB     2 g 200 mL/hr over 30 Minutes Intravenous  Once 12/14/19 0815 12/14/19 1117   12/14/19 0830  metroNIDAZOLE (FLAGYL) IVPB 500 mg     500 mg 100 mL/hr over 60 Minutes Intravenous  Once 12/14/19 0815 12/14/19 1117       Subjective: Awake, sitting in chair, responds to some questions. Has been harder to arouse today.  Objective: Vitals:   12/19/19 1412 12/19/19 1959 12/20/19 0426 12/20/19 1147  BP: 122/63 134/66 139/79 139/69  Pulse: 81 79 75 79  Resp: 15 (!) Temp: 98 F (36.7 C) 98.3 F (36.8 C) 97.6 F (36.4  C) (!) 97.5 F (36.4 C)  TempSrc: Oral  Oral Oral  SpO2: 93% 92% 93% 95%  Weight:      Height:        Intake/Output Summary (Last 24 hours) at 12/20/2019 1658 Last data filed at 12/20/2019 1422 Gross per 24 hour  Intake 0 ml  Output 450 ml  Net -450 ml   Filed Weights   12/14/19 0819  Weight: 90.7 kg    Examination:  General exam: Appears calm and comfortable  Respiratory system:Respiratory effort normal. Cardiovascular system: S1 & S2 heard, RRR.  Gastrointestinal system: Abdomen is nondistended, soft and nontender.  Central nervous system: Alert and oriented. No focal neurological deficits. Extremities: Symmetric  Skin: No rash   Data Reviewed: I have personally reviewed  following labs and imaging studies  CBC: Recent Labs  Lab 12/14/19 0725 12/14/19 0725 12/15/19 0435 12/15/19 0435 12/16/19 0246 12/16/19 0246 12/17/19 0222 12/17/19 1149 12/18/19 0448 12/19/19 0433 12/20/19 0424  WBC 3.2*   < > 18.6*   < > 14.7*  --  10.7*  --  8.6 4.5 5.6  NEUTROABS 2.9  --  17.1*  --   --   --   --   --   --   --   --   HGB 13.7   < > 11.8*   < > 11.9*  --  12.5*  --  13.0 13.1 12.5*  HCT 42.8   < > 36.4*   < > 35.4*  --  37.9*  --  39.5 38.3* 38.4*  MCV 87.9   < > 88.8   < > 85.5  --  87.3  --  86.8 83.8 86.9  PLT 246   < > 144*   < > 90*   < > 40* 36* 21* 11* 18*   < > = values in this interval not displayed.   Basic Metabolic Panel: Recent Labs  Lab 12/15/19 0435 12/16/19 0246 12/16/19 0715 12/16/19 0715 12/17/19 0222 12/18/19 0448 12/19/19 0433 12/19/19 1847 12/20/19 0424  NA 135   < > 136  --  135 141 138  --  134*  K 3.9   < > 3.6   < > 3.5 3.2* 2.7* 3.7 3.0*  CL 105   < > 105  --  102 103 104  --  100  CO2 19*   < > 18*  --  24 23 26   --  25  GLUCOSE 91   < > 92  --  159* 110* 106*  --  121*  BUN 20   < > 20  --  27* 29* 22  --  21  CREATININE 1.48*   < > 1.09  --  1.10 1.05 0.87  --  0.94  CALCIUM 7.5*   < > 7.6*  --  7.9* 8.4* 7.2*  --  7.8*  MG 0.7*  --  1.3*  --  1.9  --   --  1.3*  --    < > = values in this interval not displayed.   GFR: Estimated Creatinine Clearance: 69.5 mL/min (by C-G formula based on SCr of 0.94 mg/dL). Liver Function Tests: Recent Labs  Lab 12/14/19 0725  AST 25  ALT 17  ALKPHOS 109  BILITOT 1.5*  PROT 6.7  ALBUMIN 4.0   Recent Labs  Lab 12/14/19 0725  LIPASE 39   No results for input(s): AMMONIA in the last 168 hours. Coagulation Profile: Recent Labs  Lab 12/14/19 0725 12/15/19  8676 12/17/19 1149  INR 1.2 1.4* 0.9   Cardiac Enzymes: No results for input(s): CKTOTAL, CKMB, CKMBINDEX, TROPONINI in the last 168 hours. BNP (last 3 results) No results for input(s): PROBNP in the last 8760  hours. HbA1C: No results for input(s): HGBA1C in the last 72 hours. CBG: Recent Labs  Lab 12/19/19 1801 12/19/19 2355 12/20/19 0648 12/20/19 1144 12/20/19 1643  GLUCAP 115* 109* 119* 99 119*   Lipid Profile: No results for input(s): CHOL, HDL, LDLCALC, TRIG, CHOLHDL, LDLDIRECT in the last 72 hours. Thyroid Function Tests: No results for input(s): TSH, T4TOTAL, FREET4, T3FREE, THYROIDAB in the last 72 hours. Anemia Panel: No results for input(s): VITAMINB12, FOLATE, FERRITIN, TIBC, IRON, RETICCTPCT in the last 72 hours. Sepsis Labs: Recent Labs  Lab 12/14/19 2055 12/15/19 0030 12/15/19 0435 12/16/19 0850 12/17/19 0222 12/18/19 0448  PROCALCITON  --   --  97.64  --  27.80 12.48  LATICACIDVEN 3.3* 3.3* 2.7* 1.8  --   --     Recent Results (from the past 240 hour(s))  Urine Culture     Status: None   Collection Time: 12/12/19  3:36 PM   Specimen: Urine, Random  Result Value Ref Range Status   Specimen Description   Final    URINE, RANDOM Performed at [facility], [place] [address], [place], [place] [address]    Special Requests   Final    NONE Performed at [facility], [place] [address] [place], [place] [address]    Culture   Final    NO GROWTH Performed at [facility], [place] [address] [place], [place] [address]    Report Status 12/14/2019 FINAL  Final  Blood Culture (routine x 2)     Status: Abnormal   Collection Time: 12/14/19  7:25 AM   Specimen: BLOOD  Result Value Ref Range Status   Specimen Description   Final    BLOOD LEFT ANTECUBITAL Performed at [place] [address] [place], [place] [address]    Special Requests   Final    BOTTLES DRAWN AEROBIC ONLY Blood Culture adequate volume Performed at [place] [address], [place], [place] [address]    Culture  Setup Time   Final    GRAM NEGATIVE RODS GRAM POSITIVE COCCI IN CLUSTERS AEROBIC BOTTLE ONLY CRITICAL  RESULT CALLED TO, READ BACK BY AND VERIFIED WITH: J. GRIMSLEY,PHARMD 0518 12/15/2019 T. TYSOR    Culture (A)  Final    PROTEUS MIRABILIS STAPHYLOCOCCUS SPECIES (COAGULASE NEGATIVE) THE SIGNIFICANCE OF ISOLATING THIS ORGANISM FROM A SINGLE SET OF BLOOD CULTURES WHEN MULTIPLE SETS ARE DRAWN IS UNCERTAIN. PLEASE NOTIFY THE MICROBIOLOGY DEPARTMENT WITHIN ONE WEEK IF SPECIATION AND SENSITIVITIES ARE REQUIRED. Performed at [facility], [place] [address], [place], [place] [address]    Report Status 12/17/2019 FINAL  Final   Organism ID, Bacteria PROTEUS MIRABILIS  Final      Susceptibility   Proteus mirabilis - MIC*    AMPICILLIN <=2 SENSITIVE Sensitive     CEFAZOLIN <=4 SENSITIVE Sensitive     CEFEPIME <=1 SENSITIVE Sensitive     CEFTAZIDIME <=1 SENSITIVE Sensitive     CEFTRIAXONE <=1 SENSITIVE Sensitive     CIPROFLOXACIN <=0.25 SENSITIVE Sensitive     GENTAMICIN <=1 SENSITIVE Sensitive     IMIPENEM 2 SENSITIVE Sensitive     TRIMETH/SULFA <=20 SENSITIVE Sensitive     AMPICILLIN/SULBACTAM <=2 SENSITIVE Sensitive     PIP/TAZO <=4 SENSITIVE  Sensitive     * PROTEUS MIRABILIS  Blood Culture (routine x 2)     Status: Abnormal   Collection Time: 12/14/19  7:25 AM   Specimen: BLOOD  Result Value Ref Range Status   Specimen Description   Final    BLOOD BLOOD RIGHT FOREARM Performed at [facility], [address] [place], [place] [address]    Special Requests   Final    BOTTLES DRAWN AEROBIC AND ANAEROBIC Blood Culture adequate volume Performed at [facility], [address], [place], [place] [address]    Culture  Setup Time   Final    GRAM NEGATIVE RODS CRITICAL VALUE NOTED.  VALUE IS CONSISTENT WITH PREVIOUSLY REPORTED AND CALLED VALUE. IN BOTH AEROBIC AND ANAEROBIC BOTTLES    Culture (A)  Final    PROTEUS MIRABILIS SUSCEPTIBILITIES PERFORMED ON PREVIOUS CULTURE WITHIN THE LAST 5 DAYS. Performed at [facility], [address],  [place], [place] [address]    Report Status 12/17/2019 FINAL  Final  Urine culture     Status: Abnormal   Collection Time: 12/14/19  7:25 AM   Specimen: In/Out Cath Urine  Result Value Ref Range Status   Specimen Description   Final    IN/OUT CATH URINE Performed at [facility], [address], [place], [place] [address]    Special Requests   Final    NONE Performed at [facility], [address] [place], [place] [address]    Culture >=100,000 COLONIES/mL PROTEUS MIRABILIS (A)  Final   Report Status 12/16/2019 FINAL  Final   Organism ID, Bacteria PROTEUS MIRABILIS (A)  Final      Susceptibility   Proteus mirabilis - MIC*    AMPICILLIN <=2 SENSITIVE Sensitive     CEFAZOLIN <=4 SENSITIVE Sensitive     CEFTRIAXONE <=1 SENSITIVE Sensitive     CIPROFLOXACIN <=0.25 SENSITIVE Sensitive     GENTAMICIN <=1 SENSITIVE Sensitive     IMIPENEM 1 SENSITIVE Sensitive     NITROFURANTOIN 128 RESISTANT Resistant     TRIMETH/SULFA <=20 SENSITIVE Sensitive     AMPICILLIN/SULBACTAM <=2 SENSITIVE Sensitive     PIP/TAZO <=4 SENSITIVE Sensitive     * >=100,000 COLONIES/mL PROTEUS MIRABILIS  Blood Culture ID Panel (Reflexed)     Status: Abnormal   Collection Time: 12/14/19  7:25 AM  Result Value Ref Range Status   Enterococcus species NOT DETECTED NOT DETECTED Final   Listeria monocytogenes NOT DETECTED NOT DETECTED Final   Staphylococcus species DETECTED (A) NOT DETECTED Final    Comment: Methicillin (oxacillin) susceptible coagulase negative staphylococcus. Possible blood culture contaminant (unless isolated from more than one blood culture draw or clinical case suggests pathogenicity). No antibiotic treatment is indicated for blood  culture contaminants. CRITICAL RESULT CALLED TO, READ BACK BY AND VERIFIED WITH: J. GRIMSLEY,PHARMD 0518 12/15/2019 T. TYSOR    Staphylococcus aureus (BCID) NOT DETECTED NOT DETECTED Final   Methicillin resistance NOT DETECTED NOT  DETECTED Final   Streptococcus species NOT DETECTED NOT DETECTED Final   Streptococcus agalactiae NOT DETECTED NOT DETECTED Final   Streptococcus pneumoniae NOT DETECTED NOT DETECTED Final   Streptococcus pyogenes NOT DETECTED NOT DETECTED Final   Acinetobacter baumannii NOT DETECTED NOT DETECTED Final   Enterobacteriaceae species DETECTED (A) NOT DETECTED Final    Comment: Enterobacteriaceae represent a large family of gram-negative bacteria, not a single organism. CRITICAL RESULT CALLED TO, READ BACK BY AND VERIFIED WITH: J. GRIMSLEY,PHARMD 0518 12/15/2019 T.  TYSOR    Enterobacter cloacae complex NOT DETECTED NOT DETECTED Final   Escherichia coli NOT DETECTED NOT DETECTED Final   Klebsiella oxytoca NOT DETECTED NOT DETECTED Final   Klebsiella pneumoniae NOT DETECTED NOT DETECTED Final   Proteus species DETECTED (A) NOT DETECTED Final    Comment: CRITICAL RESULT CALLED TO, READ BACK BY AND VERIFIED WITH: J. GRIMSLEY,PHARMD 0518 12/15/2019 T. TYSOR    Serratia marcescens NOT DETECTED NOT DETECTED Final   Carbapenem resistance NOT DETECTED NOT DETECTED Final   Haemophilus influenzae NOT DETECTED NOT DETECTED Final   Neisseria meningitidis NOT DETECTED NOT DETECTED Final   Pseudomonas aeruginosa NOT DETECTED NOT DETECTED Final   Candida albicans NOT DETECTED NOT DETECTED Final   Candida glabrata NOT DETECTED NOT DETECTED Final   Candida krusei NOT DETECTED NOT DETECTED Final   Candida parapsilosis NOT DETECTED NOT DETECTED Final   Candida tropicalis NOT DETECTED NOT DETECTED Final    Comment: Performed at [facility], [address], [place], [place] [address]  SARS Coronavirus 2 by RT PCR (hospital order, performed in [facility] hospital lab) Nasopharyngeal Nasopharyngeal Swab     Status: None   Collection Time: 12/14/19 11:02 AM   Specimen: Nasopharyngeal Swab  Result Value Ref Range Status   SARS Coronavirus 2 NEGATIVE NEGATIVE Final    Comment: (NOTE) SARS-CoV-2 target  nucleic acids are NOT DETECTED. The SARS-CoV-2 RNA is generally detectable in upper and lower respiratory specimens during the acute phase of infection. The lowest concentration of SARS-CoV-2 viral copies this assay can detect is 250 copies / mL. A negative result does not preclude SARS-CoV-2 infection and should not be used as the sole basis for treatment or other patient management decisions.  A negative result may occur with improper specimen collection / handling, submission of specimen other than nasopharyngeal swab, presence of viral mutation(s) within the areas targeted by this assay, and inadequate number of viral copies (<250 copies / mL). A negative result must be combined with clinical observations, patient history, and epidemiological information. Fact Sheet for Patients:   BoilerBrush.com.cy Fact Sheet for Healthcare Providers: https://pope.com/ This test is not yet approved or cleared  by the [place] FDA and has been authorized for detection and/or diagnosis of SARS-CoV-2 by FDA under an Emergency Use Authorization (EUA).  This EUA will remain in effect (meaning this test can be used) for the duration of the COVID-19 declaration under Section 564(b)(1) of the Act, 21 U.S.C. section 360bbb-3(b)(1), unless the authorization is terminated or revoked sooner. Performed at [facility], [address], [place], [place] [address]   MRSA PCR Screening     Status: None   Collection Time: 12/14/19  8:28 PM   Specimen: Nasal Mucosa; Nasopharyngeal  Result Value Ref Range Status   MRSA by PCR NEGATIVE NEGATIVE Final    Comment:        The GeneXpert MRSA Assay (FDA approved for NASAL specimens only), is one component of a comprehensive MRSA colonization surveillance program. It is not intended to diagnose MRSA infection nor to guide or monitor treatment for MRSA infections. Performed at [facility], [address], [place], [place] [address]       Radiology Studies: [facility] Chest Port 1 View  Result Date: 12/19/2019 CLINICAL DATA:  Congestive heart failure. EXAM: PORTABLE CHEST 1 VIEW COMPARISON:  One-view chest x-ray 12/17/2019 FINDINGS: Heart size is normal. Atherosclerotic calcifications are present at the aortic arch. Diffuse interstitial pattern is slightly  improved, right greater than left. Small effusions remain, right greater than left. IMPRESSION: 1. Slight improvement in interstitial pattern, right greater than left. 2. Persistent small bilateral effusions, right greater than left. Electronically Signed   By: Marin Roberts M.D.   On: 12/19/2019 11:33     Scheduled Meds: . allopurinol  300 mg Oral Daily  . atorvastatin  20 mg Oral QPM  . buPROPion  100 mg Oral Daily  . Chlorhexidine Gluconate Cloth  6 each Topical Daily  . escitalopram  10 mg Oral Daily  . insulin aspart  0-6 Units Subcutaneous Q6H  . magnesium chloride  2 tablet Oral Daily  . pantoprazole (PROTONIX) IV  40 mg Intravenous Q24H  . potassium chloride  40 mEq Oral TID  . sodium chloride flush  3 mL Intravenous Q12H  . valACYclovir  500 mg Oral Daily   Continuous Infusions: . lactated ringers 30 mL/hr at 12/17/19 0800  . lactated ringers 30 mL/hr at 12/18/19 1909  . piperacillin-tazobactam (ZOSYN)  IV 3.375 g (12/20/19 1143)     LOS: 6 days    Reva Bores, MD 12/20/2019 4:58 PM [phone] [facility] If 7PM-7AM, please contact night-coverage 12/20/2019, 4:58 PM

## 2019-12-20 NOTE — Progress Notes (Signed)
Daily Progress Note   Patient Name: Clifford Dennis       Date: 12/20/2019 DOB: 11-Mar-1941  Age: 79 y.o. MRN#: [id number] Attending Physician: Donnamae Jude, MD Primary Care Physician: System, Pcp Not In Admit Date: 12/14/2019  Reason for Consultation/Follow-up: Establishing goals of care  Subjective:  patient is confused. He appears chronically ill, doesn't respond or interact much today.   Along with Dr Kennon Rounds, I met with the patient's son and daughter in law at the bedside. See below.   Length of Stay: 6  Current Medications: Scheduled Meds:  . allopurinol  300 mg Oral Daily  . atorvastatin  20 mg Oral QPM  . buPROPion  100 mg Oral Daily  . Chlorhexidine Gluconate Cloth  6 each Topical Daily  . escitalopram  10 mg Oral Daily  . insulin aspart  0-6 Units Subcutaneous Q6H  . magnesium chloride  2 tablet Oral Daily  . pantoprazole (PROTONIX) IV  40 mg Intravenous Q24H  . potassium chloride  40 mEq Oral TID  . sodium chloride flush  3 mL Intravenous Q12H  . valACYclovir  500 mg Oral Daily    Continuous Infusions: . lactated ringers 30 mL/hr at 12/17/19 0800  . lactated ringers 30 mL/hr at 12/18/19 1909  . piperacillin-tazobactam (ZOSYN)  IV 3.375 g (12/20/19 1143)    PRN Meds: acetaminophen **OR** acetaminophen, albuterol, ALPRAZolam, hydrALAZINE, labetalol, OLANZapine  Physical Exam         Confused Has foley Shallow but clear breath sounds S1 S2 Abdomen not tender Has bruises petechiae in both UE and LE Has been more sleepy and confused upon awakening today.   Vital Signs: BP 139/69 (BP Location: Right Arm)   Pulse 79   Temp (!) 97.5 F (36.4 C) (Oral)   Resp 18   Ht 5' 7"  (1.702 m)   Wt 90.7 kg   SpO2 95%   BMI 31.32 kg/m  SpO2: SpO2: 95 % O2 Device: O2  Device: Room Air O2 Flow Rate: O2 Flow Rate (L/min): 2 L/min  Intake/output summary:   Intake/Output Summary (Last 24 hours) at 12/20/2019 1517 Last data filed at 12/20/2019 1422 Gross per 24 hour  Intake 276.12 ml  Output 450 ml  Net -173.88 ml   LBM: Last BM Date: 12/19/19 Baseline Weight: Weight: 90.7 kg Most recent weight:  Weight: 90.7 kg       Palliative Assessment/Data:      Patient Active Problem List   Diagnosis Date Noted  . Palliative care by specialist   . Goals of care, counseling/discussion   . General weakness   . Severe sepsis ([place]) 12/14/2019  . HTN (hypertension) 12/14/2019  . Dementia ([place]) 12/14/2019  . Impaired mobility and ADLs 12/14/2019  . AKI (acute kidney injury) ([place]) 12/14/2019    Palliative Care Assessment & Plan   Patient Profile:  Clifford Dennis is a 79 year old male with a past medical history significant for hypertension, hyperlipidemia, dementia, and gout. The patient currently resides at [facility] assisted living and was brought to the emergency room after recurrent fall. He was recently in the emergency room on 12/12/2019 following a fall as well, also striking his head.  Patient admitted to [facility] service for severe sepsis, urosepsis proteus bacteremia. He has acute hypoxic resp failure, possibly ARDS versus multi focal PNA. He has DM HLD HTN. Hospital course complicated by AKI, dysphagia, altered mental status.   A palliative consult has been requested for goals of care discussions.    Assessment:  confusion Severe infection Low platelets Dysphagia Risk for aspiration  Recommendations/Plan:  ongoing decline Continues with current mode of care Monitor hospital course Will recommend transition to comfort care and residential hospice as the patient has ongoing decline. Still with poor to nil PO intake, ongoing serious illness of infection and low platelets.   Prognosis appears to be limited, could be as short as 2 weeks or  so. This has been discussed with son and daughter in law.   Will notify hospice liaison in am 12-21-19. Hospice options discussed with family, preference is made for local hospice in [facility].     Code Status:    Code Status Orders  (From admission, onward)         Start     Ordered   12/14/19 1413  Do not attempt resuscitation (DNR)  Continuous    Question Answer Comment  In the event of cardiac or respiratory ARREST Do not call a "code blue"   In the event of cardiac or respiratory ARREST Do not perform Intubation, CPR, defibrillation or ACLS   In the event of cardiac or respiratory ARREST Use medication by any route, position, wound care, and other measures to relive pain and suffering. May use oxygen, suction and manual treatment of airway obstruction as needed for comfort.      12/14/19 1412        Code Status History    This patient has a current code status but no historical code status.   Advance Care Planning Activity    Advance Directive Documentation     Most Recent Value  Type of Advance Directive  Living will  Pre-existing out of facility DNR order (yellow form or pink MOST form)  --  "MOST" Form in Place?  --       Prognosis:   less than 2 weeks.  Discharge Planning:  Hospice facility residential hospice.   Care plan was discussed with  Patient TRH MD RN son daughter in law who is a PT and Medical sales representative.   Thank you for allowing the Palliative Medicine Team to assist in the care of this patient.   Time In: 1430 Time Out: 1505 Total Time 35 Prolonged Time Billed No.      Greater than 50%  of this time was spent counseling and coordinating  care related to the above assessment and plan.  Loistine Chance, MD  Please contact Palliative Medicine Team phone at [phone] for questions and concerns.

## 2019-12-20 NOTE — Progress Notes (Signed)
Critical Lab Platelets 18 Made provider aware

## 2019-12-21 LAB — GLUCOSE, CAPILLARY
Glucose-Capillary: 143 mg/dL — ABNORMAL HIGH (ref 70–99)
Glucose-Capillary: 154 mg/dL — ABNORMAL HIGH (ref 70–99)
Glucose-Capillary: 156 mg/dL — ABNORMAL HIGH (ref 70–99)
Glucose-Capillary: 231 mg/dL — ABNORMAL HIGH (ref 70–99)

## 2019-12-21 LAB — BASIC METABOLIC PANEL
Anion gap: 8 (ref 5–15)
BUN: 18 mg/dL (ref 8–23)
CO2: 26 mmol/L (ref 22–32)
Calcium: 7.6 mg/dL — ABNORMAL LOW (ref 8.9–10.3)
Chloride: 101 mmol/L (ref 98–111)
Creatinine, Ser: 0.79 mg/dL (ref 0.61–1.24)
GFR calc Af Amer: >60 mL/min (ref >60)
GFR calc non Af Amer: >60 mL/min (ref >60)
Glucose, Bld: 191 mg/dL — ABNORMAL HIGH (ref 70–99)
Potassium: 2.7 mmol/L — CL (ref 3.5–5.1)
Sodium: 135 mmol/L (ref 135–145)

## 2019-12-21 LAB — BRAIN NATRIURETIC PEPTIDE: B Natriuretic Peptide: 151.1 pg/mL — ABNORMAL HIGH (ref 0.0–100.0)

## 2019-12-21 LAB — CBC
HCT: 36 % — ABNORMAL LOW (ref 39.0–52.0)
Hemoglobin: 11.4 g/dL — ABNORMAL LOW (ref 13.0–17.0)
MCH: 27.9 pg (ref 26.0–34.0)
MCHC: 31.7 g/dL (ref 30.0–36.0)
MCV: 88 fL (ref 80.0–100.0)
Platelets: 43 10*3/uL — ABNORMAL LOW (ref 150–400)
RBC: 4.09 MIL/uL — ABNORMAL LOW (ref 4.22–5.81)
RDW: 14.1 % (ref 11.5–15.5)
WBC: 6.3 10*3/uL (ref 4.0–10.5)
nRBC: 0 % (ref 0.0–0.2)

## 2019-12-21 MED ORDER — POTASSIUM CHLORIDE 10 MEQ/100ML IV SOLN: 10.0000 meq | INTRAVENOUS | Status: DC

## 2019-12-21 MED ORDER — POLYETHYLENE GLYCOL 3350 17 G PO PACK
17.0000 g | PACK | Freq: Every day | ORAL | Status: DC
  Administered 2019-12-21 – 2019-12-22 (×2): 17 g via ORAL
  Filled 2019-12-21 (×2): qty 1

## 2019-12-21 MED ORDER — POTASSIUM CHLORIDE 10 MEQ/100ML IV SOLN
10.0000 meq | INTRAVENOUS | Status: AC
  Administered 2019-12-21 (×2): 10 meq via INTRAVENOUS
  Filled 2019-12-21 (×2): qty 100

## 2019-12-21 MED ORDER — POTASSIUM CHLORIDE CRYS ER 20 MEQ PO TBCR
40.0000 meq | EXTENDED_RELEASE_TABLET | Freq: Two times a day (BID) | ORAL | Status: DC
  Administered 2019-12-21 – 2019-12-22 (×3): 40 meq via ORAL
  Filled 2019-12-21 (×3): qty 2

## 2019-12-21 MED ORDER — MAGNESIUM OXIDE 400 (241.3 MG) MG PO TABS
400.0000 mg | ORAL_TABLET | Freq: Two times a day (BID) | ORAL | Status: DC
  Administered 2019-12-21 – 2019-12-22 (×3): 400 mg via ORAL
  Filled 2019-12-21 (×3): qty 1

## 2019-12-21 NOTE — Progress Notes (Signed)
Patient ID: Clifford Dennis, male   DOB: 1941/05/07, 79 y.o.   MRN: [id number]  PROGRESS NOTE    Clifford Dennis  EGB:[id number] DOB: March 29, 1941 DOA: 12/14/2019 PCP: System, Pcp Not In    Brief Narrative:  [name] with PMH HTN,HLD,dementia,gout from [facility] sent for recurrent fall. He was just in the ER on 5/21 after a fall as well, also striking his head. The CT of his head revealed moderate cerebral atrophy but no acute bleed. CT of his C-spine also was negative for acute injuries. X-ray of right knee also negative. On 5/23 he stated that he hit his right arm but did not fall to the ground nor strike his head. He is a difficult historian and most of HPI is obtained from chart review. On 12/12/2019 it was reported that he was having dizziness and weakness more so on the left side since Saturday of last week. There is reported history of decreased oral intake.Urinalysis and lab work-up were relatively unremarkable and after imaging studies, he was back to his mental baseline and he was discharged back to his ALF. He did require a straight catheterization due to difficulty voiding however prior to being discharged back.  Patient was admitted for severe sepsis/lactic acidosis/UTI. Blood culture and urine culture grew Proteus. 5/25- respiratory distress, with uncontrolled hypertension and 200: 60 still bilateral opacities pulmonary edema versus multifocal pneumonia given 60 of IV Lasix and steroids and had significant improvement with good urine output and seen by cardiology.   Assessment & Plan:   Principal Problem:   Severe sepsis ([place]) Active Problems:   HTN (hypertension)   Dementia (HCC)   Impaired mobility and ADLs   AKI (acute kidney injury) ([place])   Palliative care by specialist   Goals of care, counseling/discussion   General weakness   Severe sepsis due to Proteus bacteremia from Proteus PXT:GGYIRSWNIOEVOJJ stable.Chest x-ray5/26showed unchanged right greater than left  opacities-multifocal pneumonia versus asymmetric edema. Coag neg staphbacteremia- likely contamination.continueZosyn( started on5/25)to cover aspiration/CAPand bacteremia.Procalcitonin downtrending, afebrile and WBC count stable.Repeat CXR today hopefull descalate to unasyn soon.Mild peri-cholecystic fluid in the CT scan which is resolved on the right upper quadrant ultrasound.monitor.  Proteus UTI-abx as above.  Acute metabolic encephalopathy due to sepsis in the setting of dementia with very poor short-term memory. Continue to provide supportive care fall precautions.Continue as needed Zyprexa. Monitor EKG in the morning- ordered EKG- ifQTC stable we can consider Seroquel at bedtime.SLP eval DonestartedDYS 3 diet. Resume home bupropion/Celexa, Xanax, Neurontin.  Acute hypoxic respiratory failure with respiratory distress : Chest x-ray multiple focal pneumonia versus asymmetric edema improved with Lasix x2.Currently doing well on room air.  Right greater than left lung opacities:Multifocal pneumonia versus asymmetric pulmonary edemaRepeat chest x-ray today. Patient remains on empiric Zosyn. Patient is not able to cough oxygen after Lasix x2 doses-suspecting acute pulmonary edema from uncontrolled htn and iv hydration.Off ivf.Echocardiogram was obtained overall stable. Aspiration strong possibility  Positive troponins from demand mismatch:trops flat.Seen by cardiology.  Severe hypokalemiarepleting with IVF, aspirated on KCL.Follow-up BMP.  Hypomagnesemia-repleted  NSVT- stable  Lactic acidosis/metabolic acidosis.Lactate improved.  AKI: resolved. Suspect from sepsis/volume depletion.Hold Home lisinopril Metformin on Hold.  Blood pressure fairly stable now.Home lisinopril on hold. Continue as needed IV meds   Diabetes mellitus type 2:A1c stable 6.6. sugar controlled, cont to monitor and cont ssi  Coagulopathy INR 1.4.Likely from sepsis.  Monitor.  Severe thrombocytopenia:Platelet count continues to drop,HITAb is negative.Continue to monitor appreciate hematology input transfuse if bleeding or if  less than 10,000 --rebounding well plts are 43 today  Impaired mobility and ADLs:Moved to ALF 3 wks ago and has declined rapidly as per son.He stopped eating since moving, PT/OT eval. patient has had frequent falls at ALF. Pt/ot recommend SNF  Hyperlipidemiaon statin  GOC: DNR, palliative following-will seek Hospice care given decline   DVT prophylaxis: SCD/Compression stockings Code Status: DNR  Family Communication: Patient  Disposition Plan: Inpatient Hospice   Consultants:   Heme/Onc  Palliative Care  Procedures:  Cardiology  Heme/Onc  Palliative Care  Antimicrobials: Anti-infectives (From admission, onward)   Start     Dose/Rate Route Frequency Ordered Stop   12/16/19 1200  piperacillin-tazobactam (ZOSYN) IVPB 3.375 g     3.375 g 12.5 mL/hr over 240 Minutes Intravenous Every 8 hours 12/16/19 1035     12/15/19 1300  vancomycin (VANCOCIN) IVPB 1000 mg/200 mL premix  Status:  Discontinued     1,000 mg 200 mL/hr over 60 Minutes Intravenous Every 24 hours 12/14/19 1237 12/15/19 0526   12/15/19 0600  cefTRIAXone (ROCEPHIN) 2 g in sodium chloride 0.9 % 100 mL IVPB  Status:  Discontinued     2 g 200 mL/hr over 30 Minutes Intravenous Every 24 hours 12/15/19 0527 12/16/19 1035   12/14/19 2000  ceFEPIme (MAXIPIME) 2 g in sodium chloride 0.9 % 100 mL IVPB  Status:  Discontinued     2 g 200 mL/hr over 30 Minutes Intravenous Every 12 hours 12/14/19 1210 12/15/19 0526   12/14/19 1600  metroNIDAZOLE (FLAGYL) IVPB 500 mg  Status:  Discontinued     500 mg 100 mL/hr over 60 Minutes Intravenous Every 8 hours 12/14/19 1210 12/15/19 0526   12/14/19 1415  valACYclovir (VALTREX) tablet 500 mg     500 mg Oral Daily 12/14/19 1412     12/14/19 1230  vancomycin (VANCOREADY) IVPB 2000 mg/400 mL     2,000 mg 200 mL/hr  over 120 Minutes Intravenous  Once 12/14/19 1223 12/14/19 1655   12/14/19 0830  ceFEPIme (MAXIPIME) 2 g in sodium chloride 0.9 % 100 mL IVPB     2 g 200 mL/hr over 30 Minutes Intravenous  Once 12/14/19 0815 12/14/19 1117   12/14/19 0830  metroNIDAZOLE (FLAGYL) IVPB 500 mg     500 mg 100 mL/hr over 60 Minutes Intravenous  Once 12/14/19 0815 12/14/19 1117       Subjective: More awake and talkative today. Has eaten a bit more.  Objective: Vitals:   12/20/19 0426 12/20/19 1147 12/20/19 2107 12/21/19 0615  BP: 139/79 139/69 (!) 139/95 128/75  Pulse: 75 79 74 75  Resp: Temp: 97.6 F (36.4 C) (!) 97.5 F (36.4 C) 97.6 F (36.4 C) 98.3 F (36.8 C)  TempSrc: Oral Oral  Oral  SpO2: 93% 95% 96% 96%  Weight:      Height:        Intake/Output Summary (Last 24 hours) at 12/21/2019 1052 Last data filed at 12/20/2019 1422 Gross per 24 hour  Intake --  Output 450 ml  Net -450 ml   Filed Weights   12/14/19 0819  Weight: 90.7 kg    Examination:  General exam: Appears calm and comfortable  Respiratory system: Clear to auscultation. Respiratory effort normal. Cardiovascular system: S1 & S2 heard, RRR.  Gastrointestinal system: Abdomen is nondistended, soft and nontender.  Central nervous system: Alert and oriented. No focal neurological deficits. Extremities: Symmetric, no edema  Skin: No rashes Psychiatry: Judgement and insight appear normal. Mood &  affect appropriate.     Data Reviewed: I have personally reviewed following labs and imaging studies  CBC: Recent Labs  Lab 12/15/19 0435 12/16/19 0246 12/17/19 0222 12/17/19 0222 12/17/19 1149 12/18/19 0448 12/19/19 0433 12/20/19 0424 12/21/19 0345  WBC 18.6*   < > 10.7*  --   --  8.6 4.5 5.6 6.3  NEUTROABS 17.1*  --   --   --   --   --   --   --   --   HGB 11.8*   < > 12.5*  --   --  13.0 13.1 12.5* 11.4*  HCT 36.4*   < > 37.9*  --   --  39.5 38.3* 38.4* 36.0*  MCV 88.8   < > 87.3  --   --  86.8 83.8 86.9  88.0  PLT 144*   < > 40*   < > 36* 21* 11* 18* 43*   < > = values in this interval not displayed.   Basic Metabolic Panel: Recent Labs  Lab 12/15/19 0435 12/16/19 0246 12/16/19 0715 12/16/19 0715 12/17/19 0222 12/17/19 0222 12/18/19 0448 12/19/19 0433 12/19/19 1847 12/20/19 0424 12/21/19 0345  NA 135   < > 136   < > 135  --  141 138  --  134* 135  K 3.9   < > 3.6   < > 3.5   < > 3.2* 2.7* 3.7 3.0* 2.7*  CL 105   < > 105   < > 102  --  103 104  --  100 101  CO2 19*   < > 18*   < > 24  --  23 26  --  25 26  GLUCOSE 91   < > 92   < > 159*  --  110* 106*  --  121* 191*  BUN 20   < > 20   < > 27*  --  29* 22  --  21 18  CREATININE 1.48*   < > 1.09   < > 1.10  --  1.05 0.87  --  0.94 0.79  CALCIUM 7.5*   < > 7.6*   < > 7.9*  --  8.4* 7.2*  --  7.8* 7.6*  MG 0.7*  --  1.3*  --  1.9  --   --   --  1.3*  --   --    < > = values in this interval not displayed.   GFR: Estimated Creatinine Clearance: 81.7 mL/min (by C-G formula based on SCr of 0.79 mg/dL). Coagulation Profile: Recent Labs  Lab 12/15/19 0435 12/17/19 1149  INR 1.4* 0.9   CBG: Recent Labs  Lab 12/20/19 0648 12/20/19 1144 12/20/19 1643 12/21/19 0004 12/21/19 0609  GLUCAP 119* 99 119* 156* 154*   Sepsis Labs: Recent Labs  Lab 12/14/19 2055 12/15/19 0030 12/15/19 0435 12/16/19 0850 12/17/19 0222 12/18/19 0448  PROCALCITON  --   --  97.64  --  27.80 12.48  LATICACIDVEN 3.3* 3.3* 2.7* 1.8  --   --     Recent Results (from the past 240 hour(s))  Urine Culture     Status: None   Collection Time: 12/12/19  3:36 PM   Specimen: Urine, Random  Result Value Ref Range Status   Specimen Description   Final    URINE, RANDOM Performed at [facility], [address], [place], [place] [address]    Special Requests   Final    NONE Performed at [facility], [address] [place], [place] [address]    Culture   Final    NO GROWTH Performed at [facility],  [address], [place], [place] [address]    Report Status 12/14/2019 FINAL  Final  Blood Culture (routine x 2)     Status: Abnormal   Collection Time: 12/14/19  7:25 AM   Specimen: BLOOD  Result Value Ref Range Status   Specimen Description   Final    BLOOD LEFT ANTECUBITAL Performed at [facility], [address], [place], [place] [address]    Special Requests   Final    BOTTLES DRAWN AEROBIC ONLY Blood Culture adequate volume Performed at [facility], [address], [place], [place] [address]    Culture  Setup Time   Final    GRAM NEGATIVE RODS GRAM POSITIVE COCCI IN CLUSTERS AEROBIC BOTTLE ONLY CRITICAL RESULT CALLED TO, READ BACK BY AND VERIFIED WITH: J. GRIMSLEY,PHARMD 0518 12/15/2019 T. TYSOR    Culture (A)  Final    PROTEUS MIRABILIS STAPHYLOCOCCUS SPECIES (COAGULASE NEGATIVE) THE SIGNIFICANCE OF ISOLATING THIS ORGANISM FROM A SINGLE SET OF BLOOD CULTURES WHEN MULTIPLE SETS ARE DRAWN IS UNCERTAIN. PLEASE NOTIFY THE MICROBIOLOGY DEPARTMENT WITHIN ONE WEEK IF SPECIATION AND SENSITIVITIES ARE REQUIRED. Performed at [facility], [address] [place], [place] [address]    Report Status 12/17/2019 FINAL  Final   Organism ID, Bacteria PROTEUS MIRABILIS  Final      Susceptibility   Proteus mirabilis - MIC*    AMPICILLIN <=2 SENSITIVE Sensitive     CEFAZOLIN <=4 SENSITIVE Sensitive     CEFEPIME <=1 SENSITIVE Sensitive     CEFTAZIDIME <=1 SENSITIVE Sensitive     CEFTRIAXONE <=1 SENSITIVE Sensitive     CIPROFLOXACIN <=0.25 SENSITIVE Sensitive     GENTAMICIN <=1 SENSITIVE Sensitive     IMIPENEM 2 SENSITIVE Sensitive     TRIMETH/SULFA <=20 SENSITIVE Sensitive     AMPICILLIN/SULBACTAM <=2 SENSITIVE Sensitive     PIP/TAZO <=4 SENSITIVE Sensitive     * PROTEUS MIRABILIS  Blood Culture (routine x 2)     Status: Abnormal   Collection Time: 12/14/19  7:25 AM   Specimen: BLOOD  Result Value Ref Range Status   Specimen Description    Final    BLOOD BLOOD RIGHT FOREARM Performed at [facility], [address], [place], [place] [address]    Special Requests   Final    BOTTLES DRAWN AEROBIC AND ANAEROBIC Blood Culture adequate volume Performed at [facility], [address], [place], [place] [address]    Culture  Setup Time   Final    GRAM NEGATIVE RODS CRITICAL VALUE NOTED.  VALUE IS CONSISTENT WITH PREVIOUSLY REPORTED AND CALLED VALUE. IN BOTH AEROBIC AND ANAEROBIC BOTTLES    Culture (A)  Final    PROTEUS MIRABILIS SUSCEPTIBILITIES PERFORMED ON PREVIOUS CULTURE WITHIN THE LAST 5 DAYS. Performed at [facility], [address] [place], [place] [address]    Report Status 12/17/2019 FINAL  Final  Urine culture     Status: Abnormal   Collection Time: 12/14/19  7:25 AM   Specimen: In/Out Cath Urine  Result Value Ref Range Status   Specimen Description   Final    IN/OUT CATH URINE Performed at [facility], [address], [place], [place] [address]    Special Requests   Final    NONE Performed at [facility]  [facility], [address] [place], [place] [address]    Culture >=100,000 COLONIES/mL PROTEUS MIRABILIS (A)  Final   Report Status 12/16/2019 FINAL  Final   Organism ID, Bacteria PROTEUS MIRABILIS (A)  Final      Susceptibility   Proteus mirabilis - MIC*    AMPICILLIN <=2 SENSITIVE Sensitive     CEFAZOLIN <=4 SENSITIVE Sensitive     CEFTRIAXONE <=1 SENSITIVE Sensitive     CIPROFLOXACIN <=0.25 SENSITIVE Sensitive     GENTAMICIN <=1 SENSITIVE Sensitive     IMIPENEM 1 SENSITIVE Sensitive     NITROFURANTOIN 128 RESISTANT Resistant     TRIMETH/SULFA <=20 SENSITIVE Sensitive     AMPICILLIN/SULBACTAM <=2 SENSITIVE Sensitive     PIP/TAZO <=4 SENSITIVE Sensitive     * >=100,000 COLONIES/mL PROTEUS MIRABILIS  Blood Culture ID Panel (Reflexed)     Status: Abnormal   Collection Time: 12/14/19  7:25 AM  Result Value Ref Range Status    Enterococcus species NOT DETECTED NOT DETECTED Final   Listeria monocytogenes NOT DETECTED NOT DETECTED Final   Staphylococcus species DETECTED (A) NOT DETECTED Final    Comment: Methicillin (oxacillin) susceptible coagulase negative staphylococcus. Possible blood culture contaminant (unless isolated from more than one blood culture draw or clinical case suggests pathogenicity). No antibiotic treatment is indicated for blood  culture contaminants. CRITICAL RESULT CALLED TO, READ BACK BY AND VERIFIED WITH: J. GRIMSLEY,PHARMD 0518 12/15/2019 T. TYSOR    Staphylococcus aureus (BCID) NOT DETECTED NOT DETECTED Final   Methicillin resistance NOT DETECTED NOT DETECTED Final   Streptococcus species NOT DETECTED NOT DETECTED Final   Streptococcus agalactiae NOT DETECTED NOT DETECTED Final   Streptococcus pneumoniae NOT DETECTED NOT DETECTED Final   Streptococcus pyogenes NOT DETECTED NOT DETECTED Final   Acinetobacter baumannii NOT DETECTED NOT DETECTED Final   Enterobacteriaceae species DETECTED (A) NOT DETECTED Final    Comment: Enterobacteriaceae represent a large family of gram-negative bacteria, not a single organism. CRITICAL RESULT CALLED TO, READ BACK BY AND VERIFIED WITH: J. GRIMSLEY,PHARMD 0518 12/15/2019 T. TYSOR    Enterobacter cloacae complex NOT DETECTED NOT DETECTED Final   Escherichia coli NOT DETECTED NOT DETECTED Final   Klebsiella oxytoca NOT DETECTED NOT DETECTED Final   Klebsiella pneumoniae NOT DETECTED NOT DETECTED Final   Proteus species DETECTED (A) NOT DETECTED Final    Comment: CRITICAL RESULT CALLED TO, READ BACK BY AND VERIFIED WITH: J. GRIMSLEY,PHARMD 0518 12/15/2019 T. TYSOR    Serratia marcescens NOT DETECTED NOT DETECTED Final   Carbapenem resistance NOT DETECTED NOT DETECTED Final   Haemophilus influenzae NOT DETECTED NOT DETECTED Final   Neisseria meningitidis NOT DETECTED NOT DETECTED Final   Pseudomonas aeruginosa NOT DETECTED NOT DETECTED Final   Candida  albicans NOT DETECTED NOT DETECTED Final   Candida glabrata NOT DETECTED NOT DETECTED Final   Candida krusei NOT DETECTED NOT DETECTED Final   Candida parapsilosis NOT DETECTED NOT DETECTED Final   Candida tropicalis NOT DETECTED NOT DETECTED Final    Comment: Performed at [facility], [address] [place], [place] [address]  SARS Coronavirus 2 by RT PCR (hospital order, performed in [facility] hospital lab) Nasopharyngeal Nasopharyngeal Swab     Status: None   Collection Time: 12/14/19 11:02 AM   Specimen: Nasopharyngeal Swab  Result Value Ref Range Status   SARS Coronavirus 2 NEGATIVE NEGATIVE Final    Comment: (NOTE) SARS-CoV-2 target nucleic acids are NOT DETECTED. The SARS-CoV-2 RNA is generally detectable in upper and  lower respiratory specimens during the acute phase of infection. The lowest concentration of SARS-CoV-2 viral copies this assay can detect is 250 copies / mL. A negative result does not preclude SARS-CoV-2 infection and should not be used as the sole basis for treatment or other patient management decisions.  A negative result may occur with improper specimen collection / handling, submission of specimen other than nasopharyngeal swab, presence of viral mutation(s) within the areas targeted by this assay, and inadequate number of viral copies (<250 copies / mL). A negative result must be combined with clinical observations, patient history, and epidemiological information. Fact Sheet for Patients:   BoilerBrush.com.cy Fact Sheet for Healthcare Providers: https://pope.com/ This test is not yet approved or cleared  by the [place] FDA and has been authorized for detection and/or diagnosis of SARS-CoV-2 by FDA under an Emergency Use Authorization (EUA).  This EUA will remain in effect (meaning this test can be used) for the duration of the COVID-19 declaration under Section 564(b)(1) of the Act, 21  U.S.C. section 360bbb-3(b)(1), unless the authorization is terminated or revoked sooner. Performed at [facility], [address], [place], [place] [address]   MRSA PCR Screening     Status: None   Collection Time: 12/14/19  8:28 PM   Specimen: Nasal Mucosa; Nasopharyngeal  Result Value Ref Range Status   MRSA by PCR NEGATIVE NEGATIVE Final    Comment:        The GeneXpert MRSA Assay (FDA approved for NASAL specimens only), is one component of a comprehensive MRSA colonization surveillance program. It is not intended to diagnose MRSA infection nor to guide or monitor treatment for MRSA infections. Performed at [facility], [address], [place], [place] [address]       Radiology Studies: [facility] Chest Port 1 View  Result Date: 12/19/2019 CLINICAL DATA:  Congestive heart failure. EXAM: PORTABLE CHEST 1 VIEW COMPARISON:  One-view chest x-ray 12/17/2019 FINDINGS: Heart size is normal. Atherosclerotic calcifications are present at the aortic arch. Diffuse interstitial pattern is slightly improved, right greater than left. Small effusions remain, right greater than left. IMPRESSION: 1. Slight improvement in interstitial pattern, right greater than left. 2. Persistent small bilateral effusions, right greater than left. Electronically Signed   By: Marin Roberts M.D.   On: 12/19/2019 11:33     Scheduled Meds: . allopurinol  300 mg Oral Daily  . atorvastatin  20 mg Oral QPM  . buPROPion  100 mg Oral Daily  . Chlorhexidine Gluconate Cloth  6 each Topical Daily  . escitalopram  10 mg Oral Daily  . insulin aspart  0-6 Units Subcutaneous Q6H  . magnesium oxide  400 mg Oral BID  . pantoprazole (PROTONIX) IV  40 mg Intravenous Q24H  . potassium chloride  40 mEq Oral BID  . sodium chloride flush  3 mL Intravenous Q12H  . valACYclovir  500 mg Oral Daily   Continuous Infusions: . lactated ringers 30 mL/hr at 12/17/19 0800  . lactated ringers  30 mL/hr at 12/18/19 1909  . piperacillin-tazobactam (ZOSYN)  IV 3.375 g (12/21/19 0555)  . potassium chloride Stopped (12/21/19 0956)     LOS: 7 days    Reva Bores, MD 12/21/2019 10:52 AM [phone] [facility] If 7PM-7AM, please contact night-coverage 12/21/2019, 10:52 AM

## 2019-12-21 NOTE — Progress Notes (Signed)
Critical Potassium 2.7 Provider Made aware

## 2019-12-21 NOTE — Progress Notes (Signed)
Daily Progress Note   Patient Name: Clifford Dennis       Date: 12/21/2019 DOB: Mar 18, 1941  Age: 79 y.o. MRN#: [id number] Attending Physician: Reva Bores, MD Primary Care Physician: System, Pcp Not In Admit Date: 12/14/2019  Reason for Consultation/Follow-up: Establishing goals of care  Subjective:  patient is confused. He appears chronically ill, responds some. He asks what day it is.    Length of Stay: 7  Current Medications: Scheduled Meds:  . allopurinol  300 mg Oral Daily  . atorvastatin  20 mg Oral QPM  . buPROPion  100 mg Oral Daily  . Chlorhexidine Gluconate Cloth  6 each Topical Daily  . escitalopram  10 mg Oral Daily  . insulin aspart  0-6 Units Subcutaneous Q6H  . magnesium chloride  2 tablet Oral Daily  . pantoprazole (PROTONIX) IV  40 mg Intravenous Q24H  . potassium chloride  40 mEq Oral TID  . sodium chloride flush  3 mL Intravenous Q12H  . valACYclovir  500 mg Oral Daily    Continuous Infusions: . lactated ringers 30 mL/hr at 12/17/19 0800  . lactated ringers 30 mL/hr at 12/18/19 1909  . piperacillin-tazobactam (ZOSYN)  IV 3.375 g (12/21/19 0555)  . potassium chloride      PRN Meds: acetaminophen **OR** acetaminophen, albuterol, ALPRAZolam, hydrALAZINE, labetalol, OLANZapine  Physical Exam         Confused Has foley Shallow but clear breath sounds S1 S2 Abdomen not tender Has bruises petechiae in both UE and LE Has been more sleepy and confused upon awakening today.   Vital Signs: BP 128/75 (BP Location: Left Arm)   Pulse 75   Temp 98.3 F (36.8 C) (Oral)   Resp 18   Ht 5\' 7"  (1.702 m)   Wt 90.7 kg   SpO2 96%   BMI 31.32 kg/m  SpO2: SpO2: 96 % O2 Device: O2 Device: Room Air O2 Flow Rate: O2 Flow Rate (L/min): 2 L/min  Intake/output  summary:   Intake/Output Summary (Last 24 hours) at 12/21/2019 0916 Last data filed at 12/20/2019 1422 Gross per 24 hour  Intake --  Output 450 ml  Net -450 ml   LBM: Last BM Date: 12/19/19 Baseline Weight: Weight: 90.7 kg Most recent weight: Weight: 90.7 kg       Palliative Assessment/Data:  Patient Active Problem List   Diagnosis Date Noted  . Palliative care by specialist   . Goals of care, counseling/discussion   . General weakness   . Severe sepsis (HCC) 12/14/2019  . HTN (hypertension) 12/14/2019  . Dementia (HCC) 12/14/2019  . Impaired mobility and ADLs 12/14/2019  . AKI (acute kidney injury) (HCC) 12/14/2019    Palliative Care Assessment & Plan   Patient Profile:  [name] is a 79 year old male with a past medical history significant for hypertension, hyperlipidemia, dementia, and gout. The patient currently resides at [facility] assisted living and was brought to the emergency room after recurrent fall. He was recently in the emergency room on 12/12/2019 following a fall as well, also striking his head.  Patient admitted to [facility] service for severe sepsis, urosepsis proteus bacteremia. He has acute hypoxic resp failure, possibly ARDS versus multi focal PNA. He has DM HLD HTN. Hospital course complicated by AKI, dysphagia, altered mental status.   A palliative consult has been requested for goals of care discussions.    Assessment:  confusion Severe infection Low platelets Dysphagia Risk for aspiration  Recommendations/Plan:  ongoing decline Continues with current mode of care Monitor hospital course Will recommend transition to comfort care and residential hospice as the patient has ongoing decline. Still with poor to nil PO intake, ongoing serious illness of infection and low platelets.   Prognosis appears to be limited, could be as short as 2 weeks or so. This has been discussed with son and daughter in law.    Reached out to hospice  liaison for [facility] referral, awaiting bed availability.  Risk/burden of K replacement seems higher than any benefit, in my opinion. Patient with worsening aspiration, likely not safe for PO medications. IV K might cause burning/irritation in the UE.      Code Status:    Code Status Orders  (From admission, onward)         Start     Ordered   12/14/19 1413  Do not attempt resuscitation (DNR)  Continuous    Question Answer Comment  In the event of cardiac or respiratory ARREST Do not call a "code blue"   In the event of cardiac or respiratory ARREST Do not perform Intubation, CPR, defibrillation or ACLS   In the event of cardiac or respiratory ARREST Use medication by any route, position, wound care, and other measures to relive pain and suffering. May use oxygen, suction and manual treatment of airway obstruction as needed for comfort.      12/14/19 1412        Code Status History    This patient has a current code status but no historical code status.   Advance Care Planning Activity    Advance Directive Documentation     Most Recent Value  Type of Advance Directive  Living will  Pre-existing out of facility DNR order (yellow form or pink MOST form)  --  "MOST" Form in Place?  --       Prognosis:   less than 2 weeks.  Discharge Planning:  Hospice facility residential hospice.   Care plan was discussed with  Patient TRH MD RN son daughter in law who is a PT and Architectural technologist on 12-20-19.   Thank you for allowing the Palliative Medicine Team to assist in the care of this patient.   Time In: 9 Time Out: 9.20 Total Time 20 min.  Prolonged Time Billed No.      Greater than  50%  of this time was spent counseling and coordinating care related to the above assessment and plan.  Loistine Chance, MD  Please contact Palliative Medicine Team phone at [phone] for questions and concerns.

## 2019-12-21 NOTE — Progress Notes (Signed)
[facility])  Referral received for inpatient hospice by Dr. Linna Darner.  [facility] will update family and hospital as soon as we have a bed available.    [facility] does not have a bed for Clifford Dennis today at this point, but if that changes [facility] will update TOC team and family.  Wallis Bamberg RN, BSN, CCRN [facility] Liaison

## 2019-12-22 LAB — GLUCOSE, CAPILLARY
Glucose-Capillary: 147 mg/dL — ABNORMAL HIGH (ref 70–99)
Glucose-Capillary: 154 mg/dL — ABNORMAL HIGH (ref 70–99)
Glucose-Capillary: 204 mg/dL — ABNORMAL HIGH (ref 70–99)

## 2019-12-22 MED ORDER — STERILE WATER FOR INJECTION IJ SOLN
INTRAMUSCULAR | Status: AC
  Filled 2019-12-22: qty 10

## 2019-12-22 NOTE — Progress Notes (Signed)
Attempted to call report to [facility].  Called [phone]-  No answer.

## 2019-12-22 NOTE — Progress Notes (Signed)
Left with patr for transfer to [facility].  Pt alert and talkative.  Condom cath left on for convenience.  Will ask [facility] to remove.  Report will be called.

## 2019-12-22 NOTE — Progress Notes (Signed)
[facility] Documentation  °   °Pt has been approved for [facility] transfer. [facility] does have a bed available for pt today. Paperwork has been completed and transportation can be arranged.   °   °Please call [facility] at [phone] to give charge nurse report and fax discharge summary to [phone].  °   °Please dc any lines. May leave catheter in place if pt has one. Please send pt to [facility] with DNR paperwork.   °   °Please call with any questions.   °   °Thank you,   °Jennifer Love, RN  ° °

## 2019-12-22 NOTE — Discharge Summary (Signed)
Physician Discharge Summary  Clifford Dennis HCW:[id number] DOB: September 04, 1940 DOA: 12/14/2019  PCP: System, Pcp Not In  Admit date: 12/14/2019 Discharge date: 12/22/2019  Admitted From: Assisted Living Facility Disposition:  [facility]   Discharge Condition: Fair CODE STATUS: DNR  Brief/Interim Summary: [name] with PMH HTN,HLD,dementia,gout from [facility] sent for recurrent fall. He was just in the ER on 5/21 after a fall as well, also striking his head. The CT of his head revealed moderate cerebral atrophy but no acute bleed. CT of his C-spine also was negative for acute injuries. X-ray of right knee also negative. On 5/23 he stated that he hit his right arm but did not fall to the ground nor strike his head. He is a difficult historian and most of HPI is obtained from chart review. On 12/12/2019 it was reported that he was having dizziness and weakness more so on the left side since Saturday of last week. There is reported history of decreased oral intake.Urinalysis and lab work-up were relatively unremarkable and after imaging studies, he was back to his mental baseline and he was discharged back to his ALF. He did require a straight catheterization due to difficulty voiding however prior to being discharged back.  Patient was admitted for severe sepsis/lactic acidosis/UTI. Blood culture and urine culture grew Proteus. 5/25- respiratory distress, with uncontrolled hypertension and 200: 60 still bilateral opacities pulmonary edema versus multifocal pneumonia given 60 of IV Lasix and steroids  Discharge Diagnoses:  Principal Problem:   Severe sepsis (HCC) Active Problems:   HTN (hypertension)   Dementia (HCC)   Impaired mobility and ADLs   AKI (acute kidney injury) (HCC)   Palliative care by specialist   Goals of care, counseling/discussion   General weakness  Severe sepsis due to Proteus bacteremia from Proteus VVO:[id number] stable.Chest x-ray5/26showed unchanged  right greater than left opacities-multifocal pneumonia versus asymmetric edema. Coag neg staphbacteremia- likely contamination.continueZosyn( started on5/25)to cover aspiration/CAPand bacteremia.Procalcitonin downtrending, afebrile and WBC count stable.Repeat CXR today hopefull descalate to unasyn soon.Mild peri-cholecystic fluid in the CT scan which is resolved on the right upper quadrant ultrasound.  Proteus UTI-abx as above.  Acute metabolic encephalopathy due to sepsis in the setting of dementia with very poor short-term memory. Continue to provide supportive care fall precautions.Continue as needed Zyprexa. Monitor EKG in the morning- ordered EKG- ifQTC stable we can consider Seroquel at bedtime.SLP eval DonestartedDYS 3 diet.   Acute hypoxic respiratory failure with respiratory distress : Chest x-ray multiple focal pneumonia versus asymmetric edema improved with Lasix x2.Currently doing well on room air.  Right greater than left lung opacities:Multifocal pneumonia versus asymmetric pulmonary edemaRepeat chest x-ray today. Patient remains on empiric Zosyn. Patient is not able to cough oxygen after Lasix x2 doses-suspecting acute pulmonary edema from uncontrolled htn and iv hydration.Off ivf.Echocardiogram was obtained overall stable. Aspiration strong possibility  Positive troponins from demand mismatch:trops flat.Seen by cardiology.  Severe hypokalemiarepleting with IVF, aspirated on KCL.  Hypomagnesemia-repleted  NSVT- stable  Lactic acidosis/metabolic acidosis.Lactate improved.  AKI: resolved. Suspect from sepsis/volume depletion.  Blood pressure fairly stable now.Home lisinopril on hold.  Diabetes mellitus type 2:A1c stable 6.6. sugar controlled  Coagulopathy INR 1.4.Likely from sepsis. Monitor.  Severe thrombocytopenia:Platelet count continues to drop,HITAb is negative.Continue to monitor appreciate hematology input  transfuse if bleeding or if less than 10,000 --rebounding well plts are 43 at last check  Impaired mobility and ADLs:Moved to ALF 3 wks ago and has declined rapidly as per son.He stopped eating since moving, PT/OT  eval. patient has had frequent falls at ALF.   Hyperlipidemiaonstatin  GOC: DNR, palliative following-will seek Hospice care given decline   Discharge Instructions:   Allergies as of 12/22/2019   No Known Allergies     Medication List    STOP taking these medications   allopurinol 300 MG tablet Commonly known as: ZYLOPRIM   aspirin EC 81 MG tablet   atorvastatin 20 MG tablet Commonly known as: LIPITOR   buPROPion 100 MG 12 hr tablet Commonly known as: WELLBUTRIN SR   escitalopram 10 MG tablet Commonly known as: LEXAPRO   gabapentin 300 MG capsule Commonly known as: NEURONTIN   lisinopril 5 MG tablet Commonly known as: ZESTRIL   metFORMIN 500 MG tablet Commonly known as: GLUCOPHAGE   omeprazole 20 MG capsule Commonly known as: PRILOSEC   valACYclovir 500 MG tablet Commonly known as: VALTREX     TAKE these medications   ALPRAZolam 0.5 MG tablet Commonly known as: XANAX Take 0.5 mg by mouth 3 (three) times daily as needed for anxiety.       No Known Allergies  Consultations:  Cariology  Heme Onc  Palliative care   Procedures/Studies: CT Head Wo Contrast  Result Date: 12/12/2019 CLINICAL DATA:  79 year old male with multiple recent falls. Head injury. Dizziness and weakness. EXAM: CT HEAD WITHOUT CONTRAST CT CERVICAL SPINE WITHOUT CONTRAST TECHNIQUE: Multidetector CT imaging of the head and cervical spine was performed following the standard protocol without intravenous contrast. Multiplanar CT image reconstructions of the cervical spine were also generated. COMPARISON:  None. FINDINGS: CT HEAD FINDINGS Brain: Cerebral volume loss appears generalized with mild ex vacuo appearing ventricular enlargement. No evidence of transependymal  edema. No midline shift, mass effect, evidence of mass lesion, intracranial hemorrhage or evidence of cortically based acute infarction. No cortical encephalomalacia identified. Mild to moderate bilateral cerebral white matter hypodensity. Vascular: Calcified atherosclerosis at the skull base. No suspicious intracranial vascular hyperdensity. Skull: Intact. Sinuses/Orbits: Visualized paranasal sinuses and mastoids are clear. Other: No acute orbit or scalp soft tissue findings. CT CERVICAL SPINE FINDINGS Alignment: Preserved lordosis. Subtle anterolisthesis at C6-C7 appears to be degenerative. Cervicothoracic junction alignment is within normal limits. Bilateral posterior element alignment is within normal limits. Skull base and vertebrae: Visualized skull base is intact. No atlanto-occipital dissociation. C1 and C2 appear intact and normally aligned. No acute osseous abnormality identified. Soft tissues and spinal canal: No prevertebral fluid or swelling. No visible canal hematoma. Right greater than left cervical carotid calcified atherosclerosis. Otherwise negative noncontrast neck soft tissues. Disc levels: Widespread cervical facet arthropathy on the left. Mild for age disc and endplate degeneration. No cervical spinal stenosis suspected. Upper chest: Visible upper thoracic levels appear intact. Negative lung apices. IMPRESSION: 1. No acute traumatic injury identified in the head or cervical spine. 2. Cerebral volume loss and mild to moderate for age cerebral white matter changes - most commonly due to chronic small vessel disease. 3. Left side facet arthropathy but otherwise mild for age cervical spine degeneration. Electronically Signed   By: Odessa Fleming M.D.   On: 12/12/2019 17:29   CT Cervical Spine Wo Contrast  Result Date: 12/12/2019 CLINICAL DATA:  79 year old male with multiple recent falls. Head injury. Dizziness and weakness. EXAM: CT HEAD WITHOUT CONTRAST CT CERVICAL SPINE WITHOUT CONTRAST TECHNIQUE:  Multidetector CT imaging of the head and cervical spine was performed following the standard protocol without intravenous contrast. Multiplanar CT image reconstructions of the cervical spine were also generated. COMPARISON:  None. FINDINGS: CT HEAD  FINDINGS Brain: Cerebral volume loss appears generalized with mild ex vacuo appearing ventricular enlargement. No evidence of transependymal edema. No midline shift, mass effect, evidence of mass lesion, intracranial hemorrhage or evidence of cortically based acute infarction. No cortical encephalomalacia identified. Mild to moderate bilateral cerebral white matter hypodensity. Vascular: Calcified atherosclerosis at the skull base. No suspicious intracranial vascular hyperdensity. Skull: Intact. Sinuses/Orbits: Visualized paranasal sinuses and mastoids are clear. Other: No acute orbit or scalp soft tissue findings. CT CERVICAL SPINE FINDINGS Alignment: Preserved lordosis. Subtle anterolisthesis at C6-C7 appears to be degenerative. Cervicothoracic junction alignment is within normal limits. Bilateral posterior element alignment is within normal limits. Skull base and vertebrae: Visualized skull base is intact. No atlanto-occipital dissociation. C1 and C2 appear intact and normally aligned. No acute osseous abnormality identified. Soft tissues and spinal canal: No prevertebral fluid or swelling. No visible canal hematoma. Right greater than left cervical carotid calcified atherosclerosis. Otherwise negative noncontrast neck soft tissues. Disc levels: Widespread cervical facet arthropathy on the left. Mild for age disc and endplate degeneration. No cervical spinal stenosis suspected. Upper chest: Visible upper thoracic levels appear intact. Negative lung apices. IMPRESSION: 1. No acute traumatic injury identified in the head or cervical spine. 2. Cerebral volume loss and mild to moderate for age cerebral white matter changes - most commonly due to chronic small vessel  disease. 3. Left side facet arthropathy but otherwise mild for age cervical spine degeneration. Electronically Signed   By: Odessa Fleming M.D.   On: 12/12/2019 17:29   CT Abdomen Pelvis W Contrast  Result Date: 12/14/2019 CLINICAL DATA:  Nausea vomiting. EXAM: CT ABDOMEN AND PELVIS WITH CONTRAST TECHNIQUE: Multidetector CT imaging of the abdomen and pelvis was performed using the standard protocol following bolus administration of intravenous contrast. CONTRAST:  OMNIPAQUE IOHEXOL 300 MG/ML  SOLN COMPARISON:  None. FINDINGS: Lower chest: Enlarged heart. Calcific atherosclerotic disease of the coronary arteries and aorta. Small hiatal hernia. Hepatobiliary: Normal appearance of the liver. The gallbladder is normally distended. No radiopaque gallstones are seen. However there is a small amount of pericholecystic fluid. The common bile duct is not dilated. Pancreas: Unremarkable. No pancreatic ductal dilatation or surrounding inflammatory changes. Spleen: Normal in size without focal abnormality. Adrenals/Urinary Tract: Normal adrenal glands. No evidence of hydronephrosis or nephrolithiasis. Bilateral perirenal fat stranding. Normal ureters and urinary bladder. Stomach/Bowel: Stomach is within normal limits. Appendix appears normal. No evidence of bowel wall distention, or inflammatory changes. Masslike thickening at the ileocecal junction may represent collapsed bowel, however true soft tissue mass cannot be excluded. Vascular/Lymphatic: Aortic atherosclerosis. No enlarged abdominal or pelvic lymph nodes. Reproductive: Slight enlargement of the prostate gland. Other: No abdominal wall hernia or abnormality. No abdominopelvic ascites. Musculoskeletal: Spondylosis of the lumbosacral spine. IMPRESSION: 1. No radiopaque gallstones or biliary ductal dilation is seen, however there is a small amount of pericholecystic fluid, which may be seen in early acute cholecystitis. 2. Bilateral perirenal fat stranding,  nonspecific. Please correlate to urinalysis. 3. Masslike thickening at the ileocecal junction may represent collapsed bowel, however true soft tissue mass cannot be excluded. Please correlate to colonoscopy when clinically feasible. 4. Slight enlargement of the prostate gland. 5. Small hiatal hernia. Aortic Atherosclerosis (ICD10-I70.0). Electronically Signed   By: Ted Mcalpine M.D.   On: 12/14/2019 10:44   DG Chest Port 1 View  Result Date: 12/19/2019 CLINICAL DATA:  Congestive heart failure. EXAM: PORTABLE CHEST 1 VIEW COMPARISON:  One-view chest x-ray 12/17/2019 FINDINGS: Heart size is normal. Atherosclerotic calcifications are present  at the aortic arch. Diffuse interstitial pattern is slightly improved, right greater than left. Small effusions remain, right greater than left. IMPRESSION: 1. Slight improvement in interstitial pattern, right greater than left. 2. Persistent small bilateral effusions, right greater than left. Electronically Signed   By: Marin Roberts M.D.   On: 12/19/2019 11:33   DG Chest Port 1 View  Result Date: 12/17/2019 CLINICAL DATA:  CHF. Urosepsis. EXAM: PORTABLE CHEST 1 VIEW COMPARISON:  12/16/2019 FINDINGS: The cardiac silhouette remains upper limits of normal in size. Aortic atherosclerosis is noted. Hazy and patchy airspace and interstitial opacities throughout the right greater than left lungs are unchanged. There are likely small bilateral pleural effusions. No pneumothorax is identified. IMPRESSION: Unchanged right greater than left lung opacities which may reflect asymmetric edema or pneumonia. Electronically Signed   By: Sebastian Ache M.D.   On: 12/17/2019 08:22   DG Chest Port 1 View  Result Date: 12/16/2019 CLINICAL DATA:  Urosepsis. Congestive heart failure. EXAM: PORTABLE CHEST 1 VIEW COMPARISON:  One-view chest x-ray 12/16/2019 8:01 a.m. FINDINGS: Heart size is upper limits of normal. Atherosclerotic calcifications are present in the aorta. Asymmetric  interstitial and airspace disease is worse on the right. There is no significant interval change. Effusions are suspected. IMPRESSION: 1. No significant interval change. 2. Asymmetric interstitial and airspace disease is worse on the right, compatible with asymmetric edema or infection. 3. Probable small pleural effusions. Electronically Signed   By: Marin Roberts M.D.   On: 12/16/2019 13:28   DG Chest Port 1 View  Result Date: 12/16/2019 CLINICAL DATA:  Hypoxia. EXAM: PORTABLE CHEST 1 VIEW COMPARISON:  Dec 14, 2019. FINDINGS: Stable cardiomediastinal silhouette. No pneumothorax or pleural effusion is noted. New large bilateral lung opacities are noted, right greater than left, consistent with multifocal pneumonia or possibly edema. Bony thorax is unremarkable. IMPRESSION: New large bilateral lung opacities are noted, right greater than left, consistent with multifocal pneumonia or possibly edema. Aortic Atherosclerosis (ICD10-I70.0). Electronically Signed   By: Lupita Raider M.D.   On: 12/16/2019 08:31   DG Chest Port 1 View  Result Date: 12/14/2019 CLINICAL DATA:  Fevers EXAM: PORTABLE CHEST 1 VIEW COMPARISON:  None. FINDINGS: Cardiac shadow is within normal limits. Aortic calcifications are seen. The lungs are clear. No bony abnormality is noted. IMPRESSION: No acute abnormality seen. Electronically Signed   By: Alcide Clever M.D.   On: 12/14/2019 08:10   DG Knee Complete 4 Views Right  Result Date: 12/12/2019 CLINICAL DATA:  Diffuse right knee pain following a fall. EXAM: RIGHT KNEE - COMPLETE 4+ VIEW COMPARISON:  None. FINDINGS: Mild to moderate medial joint space narrowing with associated minimal spur formation. Minimal lateral and patellofemoral spur formation. No fracture, dislocation or effusion. IMPRESSION: No fracture. Minimal degenerative changes. Electronically Signed   By: Beckie Salts M.D.   On: 12/12/2019 16:00   ECHOCARDIOGRAM COMPLETE  Result Date: 12/16/2019     ECHOCARDIOGRAM REPORT   Patient Name:   Hasani Estock Date of Exam: 12/16/2019 Medical Rec #:  [id number]     Height:       67.0 in Accession #:    [id number]    Weight:       200.0 lb Date of Birth:  1941/03/26     BSA:          2.022 m Patient Age:    78 years      BP:           117/70 mmHg Patient  Gender: M             HR:           87 bpm. Exam Location:  Inpatient Procedure: 2D Echo, Cardiac Doppler and Color Doppler Indications:     CHF  History:         Patient has no prior history of Echocardiogram examinations.                  Signs/Symptoms:Altered Mental Status; Risk                  Factors:Hypertension, Diabetes and Dyslipidemia. Dementia,                  sepsis.  Sonographer:     Lavenia Atlas Referring Phys:  [id number] RAMESH KC Diagnosing Phys: Yates Decamp MD  Sonographer Comments: Altered mental status. IMPRESSIONS  1. Left ventricular ejection fraction, by estimation, is 60 to 65%. The left ventricle has normal function. The left ventricle has no regional wall motion abnormalities. Left ventricular diastolic parameters were normal.  2. Right ventricular systolic function is normal. The right ventricular size is normal. There is mildly elevated pulmonary artery systolic pressure.  3. The mitral valve is normal in structure. Mild mitral valve regurgitation. No evidence of mitral stenosis.  4. Node of Arentius (normal variant) noted on left coronary cusp). The aortic valve is normal in structure. Aortic valve regurgitation is not visualized. No aortic stenosis is present. FINDINGS  Left Ventricle: Left ventricular ejection fraction, by estimation, is 60 to 65%. The left ventricle has normal function. The left ventricle has no regional wall motion abnormalities. The left ventricular internal cavity size was normal in size. There is  no left ventricular hypertrophy. Left ventricular diastolic parameters were normal. Right Ventricle: The right ventricular size is normal. No increase in right ventricular  wall thickness. Right ventricular systolic function is normal. There is mildly elevated pulmonary artery systolic pressure. The tricuspid regurgitant velocity is 3.12  m/s, and with an assumed right atrial pressure of 3 mmHg, the estimated right ventricular systolic pressure is 41.9 mmHg. Left Atrium: Left atrial size was normal in size. Right Atrium: Right atrial size was normal in size. Pericardium: There is no evidence of pericardial effusion. Mitral Valve: The mitral valve is normal in structure. Normal mobility of the mitral valve leaflets. Mild mitral valve regurgitation. No evidence of mitral valve stenosis. Tricuspid Valve: The tricuspid valve is normal in structure. Tricuspid valve regurgitation is trivial. No evidence of tricuspid stenosis. Aortic Valve: Node of Arentius (normal variant) noted on left coronary cusp). The aortic valve is normal in structure. Aortic valve regurgitation is not visualized. Aortic regurgitation PHT measures 433 msec. No aortic stenosis is present. Pulmonic Valve: The pulmonic valve was normal in structure. Pulmonic valve regurgitation is trivial. No evidence of pulmonic stenosis. Aorta: The aortic root is normal in size and structure. IAS/Shunts: No atrial level shunt detected by color flow Doppler.  LEFT VENTRICLE PLAX 2D LVIDd:         4.50 cm  Diastology LVIDs:         3.40 cm  LV e' lateral:   8.49 cm/s LV PW:         1.00 cm  LV E/e' lateral: 8.7 LV IVS:        0.90 cm  LV e' medial:    9.25 cm/s LVOT diam:     2.20 cm  LV E/e' medial:  8.0 LV SV:  59 LV SV Index:   29 LVOT Area:     3.80 cm  RIGHT VENTRICLE RV Basal diam:  2.40 cm RV S prime:     18.50 cm/s TAPSE (M-mode): 2.9 cm LEFT ATRIUM             Index       RIGHT ATRIUM           Index LA diam:        3.60 cm 1.78 cm/m  RA Area:     17.30 cm LA Vol (A2C):   46.9 ml 23.19 ml/m RA Volume:   44.40 ml  21.96 ml/m LA Vol (A4C):   49.5 ml 24.48 ml/m LA Biplane Vol: 50.2 ml 24.82 ml/m  AORTIC VALVE LVOT  Vmax:   92.70 cm/s LVOT Vmean:  66.000 cm/s LVOT VTI:    0.155 m AI PHT:      433 msec  AORTA Ao Root diam: 3.40 cm MITRAL VALVE               TRICUSPID VALVE MV Area (PHT): 3.99 cm    TR Peak grad:   38.9 mmHg MV Decel Time: 190 msec    TR Vmax:        312.00 cm/s MV E velocity: 74.10 cm/s MV A velocity: 81.00 cm/s  SHUNTS MV E/A ratio:  0.91        Systemic VTI:  0.16 m                            Systemic Diam: 2.20 cm Yates Decamp MD Electronically signed by Yates Decamp MD Signature Date/Time: 12/16/2019/12:30:31 PM    Final    US Abdomen Limited RUQ  Result Date: 12/15/2019 CLINICAL DATA:  Pericholecystic fluid on CT. EXAM: ULTRASOUND ABDOMEN LIMITED RIGHT UPPER QUADRANT COMPARISON:  CT abdomen pelvis from yesterday. FINDINGS: Gallbladder: No gallstones or wall thickening visualized. No pericholecystic fluid. No sonographic Murphy sign noted by sonographer. Common bile duct: Diameter: 3 mm, normal. Liver: No focal lesion identified. Within normal limits in parenchymal echogenicity. Portal vein is patent on color Doppler imaging with normal direction of blood flow towards the liver. Other: None. IMPRESSION: 1. Normal right upper quadrant ultrasound. Pericholecystic fluid has resolved. Electronically Signed   By: Obie Dredge M.D.   On: 12/15/2019 12:48       Subjective: Sleeping and not arousable this am.  Discharge Exam: Vitals:   12/21/19 2045 12/22/19 0601  BP: 127/62 132/81  Pulse: 84 69  Resp: 17 19  Temp:  97.7 F (36.5 C)  SpO2:  98%   Vitals:   12/21/19 0615 12/21/19 1302 12/21/19 2045 12/22/19 0601  BP: 128/75 (!) 113/59 127/62 132/81  Pulse: 75 94 84 69  Resp: 18 16 17 19   Temp: 98.3 F (36.8 C) 98.6 F (37 C)  97.7 F (36.5 C)  TempSrc: Oral Oral  Oral  SpO2: 96% 96%  98%  Weight:      Height:        General: Pt is sleeping and makes noises but did not really awaken Cardiovascular: RRR, S1/S2 +, no rubs, no gallops Respiratory: CTA bilaterally, no wheezing, no  rhonchi Abdominal: Soft, NT, ND, bowel sounds + Extremities: no edema, no cyanosis    The results of significant diagnostics from this hospitalization (including imaging, microbiology, ancillary and laboratory) are listed below for reference.     Microbiology: Recent Results (from the past 240 hour(s))  Urine Culture     Status: None   Collection Time: 12/12/19  3:36 PM   Specimen: Urine, Random  Result Value Ref Range Status   Specimen Description   Final    URINE, RANDOM Performed at [facility], [address] [place], [place] [address]    Special Requests   Final    NONE Performed at [facility], [address], [place], [place] [address]    Culture   Final    NO GROWTH Performed at [facility], [address] [place], [place] [address]    Report Status 12/14/2019 FINAL  Final  Blood Culture (routine x 2)     Status: Abnormal   Collection Time: 12/14/19  7:25 AM   Specimen: BLOOD  Result Value Ref Range Status   Specimen Description   Final    BLOOD LEFT ANTECUBITAL Performed at [facility], [address], [place], [place] [address]    Special Requests   Final    BOTTLES DRAWN AEROBIC ONLY Blood Culture adequate volume Performed at [facility], [address], [place], [place] [address]    Culture  Setup Time   Final    GRAM NEGATIVE RODS GRAM POSITIVE COCCI IN CLUSTERS AEROBIC BOTTLE ONLY CRITICAL RESULT CALLED TO, READ BACK BY AND VERIFIED WITH: J. GRIMSLEY,PHARMD [id number] 12/15/2019 T. TYSOR    Culture (A)  Final    PROTEUS MIRABILIS STAPHYLOCOCCUS SPECIES (COAGULASE NEGATIVE) THE SIGNIFICANCE OF ISOLATING THIS ORGANISM FROM A SINGLE SET OF BLOOD CULTURES WHEN MULTIPLE SETS ARE DRAWN IS UNCERTAIN. PLEASE NOTIFY THE MICROBIOLOGY DEPARTMENT WITHIN ONE WEEK IF SPECIATION AND SENSITIVITIES ARE REQUIRED. Performed at [facility], [address], [place], [place] [address]     Report Status 12/17/2019 FINAL  Final   Organism ID, Bacteria PROTEUS MIRABILIS  Final      Susceptibility   Proteus mirabilis - MIC*    AMPICILLIN <=2 SENSITIVE Sensitive     CEFAZOLIN <=4 SENSITIVE Sensitive     CEFEPIME <=1 SENSITIVE Sensitive     CEFTAZIDIME <=1 SENSITIVE Sensitive     CEFTRIAXONE <=1 SENSITIVE Sensitive     CIPROFLOXACIN <=0.25 SENSITIVE Sensitive     GENTAMICIN <=1 SENSITIVE Sensitive     IMIPENEM 2 SENSITIVE Sensitive     TRIMETH/SULFA <=20 SENSITIVE Sensitive     AMPICILLIN/SULBACTAM <=2 SENSITIVE Sensitive     PIP/TAZO <=4 SENSITIVE Sensitive     * PROTEUS MIRABILIS  Blood Culture (routine x 2)     Status: Abnormal   Collection Time: 12/14/19  7:25 AM   Specimen: BLOOD  Result Value Ref Range Status   Specimen Description   Final    BLOOD BLOOD RIGHT FOREARM Performed at [facility] [facility], [address] [place], [place] [address]    Special Requests   Final    BOTTLES DRAWN AEROBIC AND ANAEROBIC Blood Culture adequate volume Performed at [facility], [address] [place], [place] [address]    Culture  Setup Time   Final    GRAM NEGATIVE RODS CRITICAL VALUE NOTED.  VALUE IS CONSISTENT WITH PREVIOUSLY REPORTED AND CALLED VALUE. IN BOTH AEROBIC AND ANAEROBIC BOTTLES    Culture (A)  Final    PROTEUS MIRABILIS SUSCEPTIBILITIES PERFORMED ON PREVIOUS CULTURE WITHIN THE LAST 5 DAYS. Performed at [facility], [address] [place], [place] [address]    Report Status 12/17/2019 FINAL  Final  Urine culture  Status: Abnormal   Collection Time: 12/14/19  7:25 AM   Specimen: In/Out Cath Urine  Result Value Ref Range Status   Specimen Description   Final    IN/OUT CATH URINE Performed at [facility], [address], [place], [place] [address]    Special Requests   Final    NONE Performed at [facility], [address], [place], [place] [address]    Culture  >=100,000 COLONIES/mL PROTEUS MIRABILIS (A)  Final   Report Status 12/16/2019 FINAL  Final   Organism ID, Bacteria PROTEUS MIRABILIS (A)  Final      Susceptibility   Proteus mirabilis - MIC*    AMPICILLIN <=2 SENSITIVE Sensitive     CEFAZOLIN <=4 SENSITIVE Sensitive     CEFTRIAXONE <=1 SENSITIVE Sensitive     CIPROFLOXACIN <=0.25 SENSITIVE Sensitive     GENTAMICIN <=1 SENSITIVE Sensitive     IMIPENEM 1 SENSITIVE Sensitive     NITROFURANTOIN 128 RESISTANT Resistant     TRIMETH/SULFA <=20 SENSITIVE Sensitive     AMPICILLIN/SULBACTAM <=2 SENSITIVE Sensitive     PIP/TAZO <=4 SENSITIVE Sensitive     * >=100,000 COLONIES/mL PROTEUS MIRABILIS  Blood Culture ID Panel (Reflexed)     Status: Abnormal   Collection Time: 12/14/19  7:25 AM  Result Value Ref Range Status   Enterococcus species NOT DETECTED NOT DETECTED Final   Listeria monocytogenes NOT DETECTED NOT DETECTED Final   Staphylococcus species DETECTED (A) NOT DETECTED Final    Comment: Methicillin (oxacillin) susceptible coagulase negative staphylococcus. Possible blood culture contaminant (unless isolated from more than one blood culture draw or clinical case suggests pathogenicity). No antibiotic treatment is indicated for blood  culture contaminants. CRITICAL RESULT CALLED TO, READ BACK BY AND VERIFIED WITH: J. GRIMSLEY,PHARMD 0518 12/15/2019 T. TYSOR    Staphylococcus aureus (BCID) NOT DETECTED NOT DETECTED Final   Methicillin resistance NOT DETECTED NOT DETECTED Final   Streptococcus species NOT DETECTED NOT DETECTED Final   Streptococcus agalactiae NOT DETECTED NOT DETECTED Final   Streptococcus pneumoniae NOT DETECTED NOT DETECTED Final   Streptococcus pyogenes NOT DETECTED NOT DETECTED Final   Acinetobacter baumannii NOT DETECTED NOT DETECTED Final   Enterobacteriaceae species DETECTED (A) NOT DETECTED Final    Comment: Enterobacteriaceae represent a large family of gram-negative bacteria, not a single organism. CRITICAL  RESULT CALLED TO, READ BACK BY AND VERIFIED WITH: J. GRIMSLEY,PHARMD 0518 12/15/2019 T. TYSOR    Enterobacter cloacae complex NOT DETECTED NOT DETECTED Final   Escherichia coli NOT DETECTED NOT DETECTED Final   Klebsiella oxytoca NOT DETECTED NOT DETECTED Final   Klebsiella pneumoniae NOT DETECTED NOT DETECTED Final   Proteus species DETECTED (A) NOT DETECTED Final    Comment: CRITICAL RESULT CALLED TO, READ BACK BY AND VERIFIED WITH: J. GRIMSLEY,PHARMD 0518 12/15/2019 T. TYSOR    Serratia marcescens NOT DETECTED NOT DETECTED Final   Carbapenem resistance NOT DETECTED NOT DETECTED Final   Haemophilus influenzae NOT DETECTED NOT DETECTED Final   Neisseria meningitidis NOT DETECTED NOT DETECTED Final   Pseudomonas aeruginosa NOT DETECTED NOT DETECTED Final   Candida albicans NOT DETECTED NOT DETECTED Final   Candida glabrata NOT DETECTED NOT DETECTED Final   Candida krusei NOT DETECTED NOT DETECTED Final   Candida parapsilosis NOT DETECTED NOT DETECTED Final   Candida tropicalis NOT DETECTED NOT DETECTED Final    Comment: Performed at [facility], [address], [place], [place] [address]  SARS Coronavirus 2 by RT PCR (hospital  order, performed in [facility] hospital lab) Nasopharyngeal Nasopharyngeal Swab     Status: None   Collection Time: 12/14/19 11:02 AM   Specimen: Nasopharyngeal Swab  Result Value Ref Range Status   SARS Coronavirus 2 NEGATIVE NEGATIVE Final    Comment: (NOTE) SARS-CoV-2 target nucleic acids are NOT DETECTED. The SARS-CoV-2 RNA is generally detectable in upper and lower respiratory specimens during the acute phase of infection. The lowest concentration of SARS-CoV-2 viral copies this assay can detect is 250 copies / mL. A negative result does not preclude SARS-CoV-2 infection and should not be used as the sole basis for treatment or other patient management decisions.  A negative result may occur with improper specimen collection / handling,  submission of specimen other than nasopharyngeal swab, presence of viral mutation(s) within the areas targeted by this assay, and inadequate number of viral copies (<250 copies / mL). A negative result must be combined with clinical observations, patient history, and epidemiological information. Fact Sheet for Patients:   BoilerBrush.com.cy Fact Sheet for Healthcare Providers: https://pope.com/ This test is not yet approved or cleared  by the [place] FDA and has been authorized for detection and/or diagnosis of SARS-CoV-2 by FDA under an Emergency Use Authorization (EUA).  This EUA will remain in effect (meaning this test can be used) for the duration of the COVID-19 declaration under Section 564(b)(1) of the Act, 21 U.S.C. section 360bbb-3(b)(1), unless the authorization is terminated or revoked sooner. Performed at [facility], [address], [place], [place] [address]   MRSA PCR Screening     Status: None   Collection Time: 12/14/19  8:28 PM   Specimen: Nasal Mucosa; Nasopharyngeal  Result Value Ref Range Status   MRSA by PCR NEGATIVE NEGATIVE Final    Comment:        The GeneXpert MRSA Assay (FDA approved for NASAL specimens only), is one component of a comprehensive MRSA colonization surveillance program. It is not intended to diagnose MRSA infection nor to guide or monitor treatment for MRSA infections. Performed at [facility], [address], [place], [place] [address]      Labs: BNP (last 3 results) Recent Labs    12/19/19 0433 12/20/19 0424 12/21/19 0345  BNP 322.1* 125.9* 151.1*   Basic Metabolic Panel: Recent Labs  Lab 12/16/19 0715 12/16/19 0715 12/17/19 0222 12/17/19 0222 12/18/19 0448 12/19/19 0433 12/19/19 1847 12/20/19 0424 12/21/19 0345  NA 136   < > 135  --  141 138  --  134* 135  K 3.6   < > 3.5   < > 3.2* 2.7* 3.7 3.0* 2.7*  CL 105   < > 102   --  103 104  --  100 101  CO2 18*   < > 24  --  23 26  --  25 26  GLUCOSE 92   < > 159*  --  110* 106*  --  121* 191*  BUN 20   < > 27*  --  29* 22  --  21 18  CREATININE 1.09   < > 1.10  --  1.05 0.87  --  0.94 0.79  CALCIUM 7.6*   < > 7.9*  --  8.4* 7.2*  --  7.8* 7.6*  MG 1.3*  --  1.9  --   --   --  1.3*  --   --    < > = values in this interval not displayed.   CBC: Recent Labs  Lab 12/17/19 0222 12/17/19 0222 12/17/19 1149 12/18/19 0448 12/19/19 0433 12/20/19 0424 12/21/19 0345  WBC 10.7*  --   --  8.6 4.5 5.6 6.3  HGB 12.5*  --   --  13.0 13.1 12.5* 11.4*  HCT 37.9*  --   --  39.5 38.3* 38.4* 36.0*  MCV 87.3  --   --  86.8 83.8 86.9 88.0  PLT 40*   < > 36* 21* 11* 18* 43*   < > = values in this interval not displayed.   CBG: Recent Labs  Lab 12/21/19 0609 12/21/19 1303 12/21/19 1637 12/22/19 0013 12/22/19 0603  GLUCAP 154* 231* 143* 204* 147*   Urinalysis    Component Value Date/Time   COLORURINE YELLOW 12/14/2019 0725   APPEARANCEUR TURBID (A) 12/14/2019 0725   LABSPEC 1.013 12/14/2019 0725   PHURINE 8.0 12/14/2019 0725   GLUCOSEU NEGATIVE 12/14/2019 0725   HGBUR LARGE (A) 12/14/2019 0725   BILIRUBINUR NEGATIVE 12/14/2019 0725   KETONESUR NEGATIVE 12/14/2019 0725   PROTEINUR 100 (A) 12/14/2019 0725   NITRITE POSITIVE (A) 12/14/2019 0725   LEUKOCYTESUR MODERATE (A) 12/14/2019 0725   Sepsis Labs Invalid input(s): PROCALCITONIN,  WBC,  LACTICIDVEN Microbiology Recent Results (from the past 240 hour(s))  Urine Culture     Status: None   Collection Time: 12/12/19  3:36 PM   Specimen: Urine, Random  Result Value Ref Range Status   Specimen Description   Final    URINE, RANDOM Performed at [facility], [address] [place], [place] [address]    Special Requests   Final    NONE Performed at [facility], [address] [place], [place] [address]    Culture   Final    NO GROWTH Performed at [facility], [address] [place], [place] [address]    Report Status 12/14/2019 FINAL  Final  Blood Culture (routine x 2)     Status: Abnormal   Collection Time: 12/14/19  7:25 AM   Specimen: BLOOD  Result Value Ref Range Status   Specimen Description   Final    BLOOD LEFT ANTECUBITAL Performed at [facility], [address] [place], [place] [address]    Special Requests   Final    BOTTLES DRAWN AEROBIC ONLY Blood Culture adequate volume Performed at [facility], [address] [place], [place] [address]    Culture  Setup Time   Final    GRAM NEGATIVE RODS GRAM POSITIVE COCCI IN CLUSTERS AEROBIC BOTTLE ONLY CRITICAL RESULT CALLED TO, READ BACK BY AND VERIFIED WITH: J. GRIMSLEY,PHARMD 0518 12/15/2019 T. TYSOR    Culture (A)  Final    PROTEUS MIRABILIS STAPHYLOCOCCUS SPECIES (COAGULASE NEGATIVE) THE SIGNIFICANCE OF ISOLATING THIS ORGANISM FROM A SINGLE SET OF BLOOD CULTURES WHEN MULTIPLE SETS ARE DRAWN IS UNCERTAIN. PLEASE NOTIFY THE MICROBIOLOGY DEPARTMENT WITHIN ONE WEEK IF SPECIATION AND SENSITIVITIES ARE REQUIRED. Performed at [facility], [address], [place], [place] [address]    Report Status 12/17/2019 FINAL  Final   Organism ID, Bacteria PROTEUS MIRABILIS  Final      Susceptibility   Proteus mirabilis - MIC*    AMPICILLIN <=2 SENSITIVE Sensitive     CEFAZOLIN <=4 SENSITIVE Sensitive     CEFEPIME <=1 SENSITIVE Sensitive     CEFTAZIDIME <=1 SENSITIVE Sensitive     CEFTRIAXONE <=1 SENSITIVE Sensitive     CIPROFLOXACIN <=0.25 SENSITIVE Sensitive     GENTAMICIN <=1  SENSITIVE Sensitive     IMIPENEM 2 SENSITIVE Sensitive     TRIMETH/SULFA <=20 SENSITIVE Sensitive     AMPICILLIN/SULBACTAM <=2 SENSITIVE Sensitive     PIP/TAZO <=4 SENSITIVE Sensitive     * PROTEUS MIRABILIS  Blood Culture (routine x 2)     Status: Abnormal   Collection Time: 12/14/19  7:25 AM   Specimen: BLOOD  Result Value Ref Range Status   Specimen  Description   Final    BLOOD BLOOD RIGHT FOREARM Performed at [facility], [address], [place], [place] [address]    Special Requests   Final    BOTTLES DRAWN AEROBIC AND ANAEROBIC Blood Culture adequate volume Performed at [facility], [address] [place], [place] [address]    Culture  Setup Time   Final    GRAM NEGATIVE RODS CRITICAL VALUE NOTED.  VALUE IS CONSISTENT WITH PREVIOUSLY REPORTED AND CALLED VALUE. IN BOTH AEROBIC AND ANAEROBIC BOTTLES    Culture (A)  Final    PROTEUS MIRABILIS SUSCEPTIBILITIES PERFORMED ON PREVIOUS CULTURE WITHIN THE LAST 5 DAYS. Performed at [facility], [address], [place], [place] [address]    Report Status 12/17/2019 FINAL  Final  Urine culture     Status: Abnormal   Collection Time: 12/14/19  7:25 AM   Specimen: In/Out Cath Urine  Result Value Ref Range Status   Specimen Description   Final    IN/OUT CATH URINE Performed at [facility], [address] [place], [place] [address]    Special Requests   Final    NONE Performed at [facility], [address] [place], [place] [address]    Culture >=100,000 COLONIES/mL PROTEUS MIRABILIS (A)  Final   Report Status 12/16/2019 FINAL  Final   Organism ID, Bacteria PROTEUS MIRABILIS (A)  Final      Susceptibility   Proteus mirabilis - MIC*    AMPICILLIN <=2 SENSITIVE Sensitive     CEFAZOLIN <=4 SENSITIVE Sensitive     CEFTRIAXONE <=1 SENSITIVE Sensitive     CIPROFLOXACIN <=0.25 SENSITIVE Sensitive     GENTAMICIN <=1 SENSITIVE Sensitive     IMIPENEM 1 SENSITIVE Sensitive     NITROFURANTOIN 128 RESISTANT Resistant     TRIMETH/SULFA <=20 SENSITIVE Sensitive     AMPICILLIN/SULBACTAM <=2 SENSITIVE Sensitive     PIP/TAZO <=4 SENSITIVE Sensitive     * >=100,000 COLONIES/mL PROTEUS MIRABILIS  Blood Culture ID Panel (Reflexed)     Status: Abnormal   Collection Time: 12/14/19  7:25 AM  Result Value Ref  Range Status   Enterococcus species NOT DETECTED NOT DETECTED Final   Listeria monocytogenes NOT DETECTED NOT DETECTED Final   Staphylococcus species DETECTED (A) NOT DETECTED Final    Comment: Methicillin (oxacillin) susceptible coagulase negative staphylococcus. Possible blood culture contaminant (unless isolated from more than one blood culture draw or clinical case suggests pathogenicity). No antibiotic treatment is indicated for blood  culture contaminants. CRITICAL RESULT CALLED TO, READ BACK BY AND VERIFIED WITH: J. GRIMSLEY,PHARMD 0518 12/15/2019 T. TYSOR    Staphylococcus aureus (BCID) NOT DETECTED NOT DETECTED Final   Methicillin resistance NOT DETECTED NOT DETECTED Final   Streptococcus species NOT DETECTED NOT DETECTED Final   Streptococcus agalactiae NOT DETECTED NOT DETECTED Final   Streptococcus pneumoniae NOT DETECTED NOT DETECTED Final   Streptococcus pyogenes NOT DETECTED NOT DETECTED Final   Acinetobacter baumannii NOT DETECTED NOT DETECTED Final   Enterobacteriaceae species DETECTED (A) NOT  DETECTED Final    Comment: Enterobacteriaceae represent a large family of gram-negative bacteria, not a single organism. CRITICAL RESULT CALLED TO, READ BACK BY AND VERIFIED WITH: J. GRIMSLEY,PHARMD 0518 12/15/2019 T. TYSOR    Enterobacter cloacae complex NOT DETECTED NOT DETECTED Final   Escherichia coli NOT DETECTED NOT DETECTED Final   Klebsiella oxytoca NOT DETECTED NOT DETECTED Final   Klebsiella pneumoniae NOT DETECTED NOT DETECTED Final   Proteus species DETECTED (A) NOT DETECTED Final    Comment: CRITICAL RESULT CALLED TO, READ BACK BY AND VERIFIED WITH: J. GRIMSLEY,PHARMD 0518 12/15/2019 T. TYSOR    Serratia marcescens NOT DETECTED NOT DETECTED Final   Carbapenem resistance NOT DETECTED NOT DETECTED Final   Haemophilus influenzae NOT DETECTED NOT DETECTED Final   Neisseria meningitidis NOT DETECTED NOT DETECTED Final   Pseudomonas aeruginosa NOT DETECTED NOT DETECTED  Final   Candida albicans NOT DETECTED NOT DETECTED Final   Candida glabrata NOT DETECTED NOT DETECTED Final   Candida krusei NOT DETECTED NOT DETECTED Final   Candida parapsilosis NOT DETECTED NOT DETECTED Final   Candida tropicalis NOT DETECTED NOT DETECTED Final    Comment: Performed at [facility], [place] [address], [place], [place] [address]  SARS Coronavirus 2 by RT PCR (hospital order, performed in [facility] hospital lab) Nasopharyngeal Nasopharyngeal Swab     Status: None   Collection Time: 12/14/19 11:02 AM   Specimen: Nasopharyngeal Swab  Result Value Ref Range Status   SARS Coronavirus 2 NEGATIVE NEGATIVE Final    Comment: (NOTE) SARS-CoV-2 target nucleic acids are NOT DETECTED. The SARS-CoV-2 RNA is generally detectable in upper and lower respiratory specimens during the acute phase of infection. The lowest concentration of SARS-CoV-2 viral copies this assay can detect is 250 copies / mL. A negative result does not preclude SARS-CoV-2 infection and should not be used as the sole basis for treatment or other patient management decisions.  A negative result may occur with improper specimen collection / handling, submission of specimen other than nasopharyngeal swab, presence of viral mutation(s) within the areas targeted by this assay, and inadequate number of viral copies (<250 copies / mL). A negative result must be combined with clinical observations, patient history, and epidemiological information. Fact Sheet for Patients:   StrictlyIdeas.no Fact Sheet for Healthcare Providers: BankingDealers.co.za This test is not yet approved or cleared  by the [place] FDA and has been authorized for detection and/or diagnosis of SARS-CoV-2 by FDA under an Emergency Use Authorization (EUA).  This EUA will remain in effect (meaning this test can be used) for the duration of the COVID-19 declaration under Section 564(b)(1) of  the Act, 21 U.S.C. section 360bbb-3(b)(1), unless the authorization is terminated or revoked sooner. Performed at [facility], [place] [address] [place], [place] [address]   MRSA PCR Screening     Status: None   Collection Time: 12/14/19  8:28 PM   Specimen: Nasal Mucosa; Nasopharyngeal  Result Value Ref Range Status   MRSA by PCR NEGATIVE NEGATIVE Final    Comment:        The GeneXpert MRSA Assay (FDA approved for NASAL specimens only), is one component of a comprehensive MRSA colonization surveillance program. It is not intended to diagnose MRSA infection nor to guide or monitor treatment for MRSA infections. Performed at [facility], [place] [address] [place],  [address]      Time coordinating discharge: Over 30 minutes  SIGNED:   Donnamae Jude, MD  [facility] 12/22/2019, 9:07 AM  If 7PM-7AM, please contact night-coverage

## 2019-12-22 NOTE — Plan of Care (Signed)
  Problem: Activity: Goal: Risk for activity intolerance will decrease Outcome: Progressing   Problem: Nutrition: Goal: Adequate nutrition will be maintained Outcome: Progressing   

## 2019-12-22 NOTE — TOC Progression Note (Signed)
Transition of Care [facility]) - Progression Note    Patient Details  Name: Clifford Dennis MRN: [id number] Date of Birth: Jul 16, 1941  [facility] CM/SW Contact  Armanda Heritage, RN Phone Number: 12/22/2019, 11:12 AM  Clinical Narrative:    Patient will discharge to [facility] today.  Bed availability confirmed and PTAR transportation arranged.     Expected Discharge Plan: Hospice Medical Facility Barriers to Discharge: No Barriers Identified  Expected Discharge Plan and Services Expected Discharge Plan: Hospice Medical Facility   Discharge Planning Services: CM Consult Post Acute Care Choice: Hospice Living arrangements for the past 2 months: Assisted Living Facility Expected Discharge Date: 12/22/19                                     Social Determinants of Health (SDOH) Interventions    Readmission Risk Interventions No flowsheet data found.

## 2019-12-22 NOTE — Progress Notes (Signed)
Civil engineer, contracting [facility])  [facility] has a bed for Clifford Dennis today.  Necessary consents have to be completed prior to him transferring.  [facility] will update TOC manager once complete, then transportation can be arranged.  RN staff, please call report at any time to [phone], bed is assigned at this time.  Please fax d/c summary can be faxed to [phone].  Wallis Bamberg RN, BSN, CCRN [facility] Liaison

## 2019-12-22 NOTE — Progress Notes (Signed)
Daily Progress Note   Patient Name: Clifford Dennis       Date: 12/22/2019 DOB: November 19, 1940  Age: 79 y.o. MRN#: [id number] Attending Physician: Reva Bores, MD Primary Care Physician: System, Pcp Not In Admit Date: 12/14/2019  Reason for Consultation/Follow-up: Establishing goals of care  Subjective:  patient appears confused and chronically ill, with ongoing functional decline. Patient with choking and aspiration when PO medications are attempted.      Length of Stay: 8  Current Medications: Scheduled Meds:  . allopurinol  300 mg Oral Daily  . atorvastatin  20 mg Oral QPM  . buPROPion  100 mg Oral Daily  . Chlorhexidine Gluconate Cloth  6 each Topical Daily  . escitalopram  10 mg Oral Daily  . insulin aspart  0-6 Units Subcutaneous Q6H  . magnesium oxide  400 mg Oral BID  . pantoprazole (PROTONIX) IV  40 mg Intravenous Q24H  . polyethylene glycol  17 g Oral Daily  . potassium chloride  40 mEq Oral BID  . sodium chloride flush  3 mL Intravenous Q12H  . valACYclovir  500 mg Oral Daily    Continuous Infusions: . lactated ringers 30 mL/hr at 12/17/19 0800  . lactated ringers 30 mL/hr at 12/18/19 1909  . piperacillin-tazobactam (ZOSYN)  IV 3.375 g (12/22/19 0500)    PRN Meds: acetaminophen **OR** acetaminophen, albuterol, ALPRAZolam, hydrALAZINE, labetalol, OLANZapine  Physical Exam         Confused Has foley Shallow but clear breath sounds S1 S2 Abdomen not tender Has bruises petechiae in both UE and LE Has been more sleepy and confused    Vital Signs: BP 132/81 (BP Location: Left Arm)   Pulse 69   Temp 97.7 F (36.5 C) (Oral)   Resp 19   Ht 5\' 7"  (1.702 m)   Wt 90.7 kg   SpO2 98%   BMI 31.32 kg/m  SpO2: SpO2: 98 % O2 Device: O2 Device: Room Air O2 Flow Rate:  O2 Flow Rate (L/min): 2 L/min  Intake/output summary:   Intake/Output Summary (Last 24 hours) at 12/22/2019 0918 Last data filed at 12/22/2019 0600 Gross per 24 hour  Intake 464 ml  Output 1300 ml  Net -836 ml   LBM: Last BM Date: 12/22/19 Baseline Weight: Weight: 90.7 kg Most recent weight: Weight: 90.7 kg  Palliative Assessment/Data:      Patient Active Problem List   Diagnosis Date Noted  . Palliative care by specialist   . Goals of care, counseling/discussion   . General weakness   . Severe sepsis ([place]) 12/14/2019  . HTN (hypertension) 12/14/2019  . Dementia ([place]) 12/14/2019  . Impaired mobility and ADLs 12/14/2019  . AKI (acute kidney injury) ([place]) 12/14/2019    Palliative Care Assessment & Plan   Patient Profile:  Clifford Dennis is a 79 year old male with a past medical history significant for hypertension, hyperlipidemia, dementia, and gout. The patient currently resides at [facility] assisted [facility] and was brought to the emergency room after recurrent fall. He was recently in the emergency room on 12/12/2019 following a fall as well, also striking his head.  Patient admitted to [facility] service for severe sepsis, urosepsis proteus bacteremia. He has acute hypoxic resp failure, possibly ARDS versus multi focal PNA. He has DM HLD HTN. Hospital course complicated by AKI, dysphagia, altered mental status.   A palliative consult has been requested for goals of care discussions.    Assessment:  confusion Severe infection Low platelets Dysphagia Risk for aspiration  Recommendations/Plan:  ongoing decline Continues with current mode of care Monitor hospital course Will recommend transition to comfort care and residential hospice as the patient has ongoing decline. Still with poor to nil PO intake, ongoing serious illness of infection and low platelets.   Prognosis appears to be limited, could be as short as 2 weeks or so. This has been discussed with son  and daughter in law.    Reached out to hospice liaison for [facility] referral, awaiting bed availability.  Risk/burden of K replacement seems higher than any benefit, in my opinion. Patient with worsening aspiration, likely not safe for PO medications. IV K might cause burning/irritation in the UE.   Patient has a bed available at local hospice facility for aggressive symptom management and dignity focused care. Anticipate discharge today. Appreciate hospice liaison follow up.      Code Status:    Code Status Orders  (From admission, onward)         Start     Ordered   12/14/19 1413  Do not attempt resuscitation (DNR)  Continuous    Question Answer Comment  In the event of cardiac or respiratory ARREST Do not call a "code blue"   In the event of cardiac or respiratory ARREST Do not perform Intubation, CPR, defibrillation or ACLS   In the event of cardiac or respiratory ARREST Use medication by any route, position, wound care, and other measures to relive pain and suffering. May use oxygen, suction and manual treatment of airway obstruction as needed for comfort.      12/14/19 1412        Code Status History    This patient has a current code status but no historical code status.   Advance Care Planning Activity    Advance Directive Documentation     Most Recent Value  Type of Advance Directive  Living will  Pre-existing out of facility DNR order (yellow form or pink MOST form)  --  "MOST" Form in Place?  --       Prognosis:   less than 2 weeks.  Discharge Planning:  Hospice facility residential hospice.   Care plan was discussed with  Patient TR[facility] MD  .   Thank you for allowing the Palliative Medicine Team to assist in the care of this patient.  Time In: 9 Time Out: 9.20 Total Time 20 min.  Prolonged Time Billed No.      Greater than 50%  of this time was spent counseling and coordinating care related to the above assessment and plan.  Rosalin Hawking,  MD  Please contact Palliative Medicine Team phone at [phone] for questions and concerns.

## 2022-01-07 IMAGING — DX DG CHEST 1V PORT
1 series · 1 of 1 positions shown · non-contrast
Comparison: December 14, 2019.

CLINICAL DATA: Hypoxia.

EXAM:
PORTABLE CHEST 1 VIEW

[chest ap]
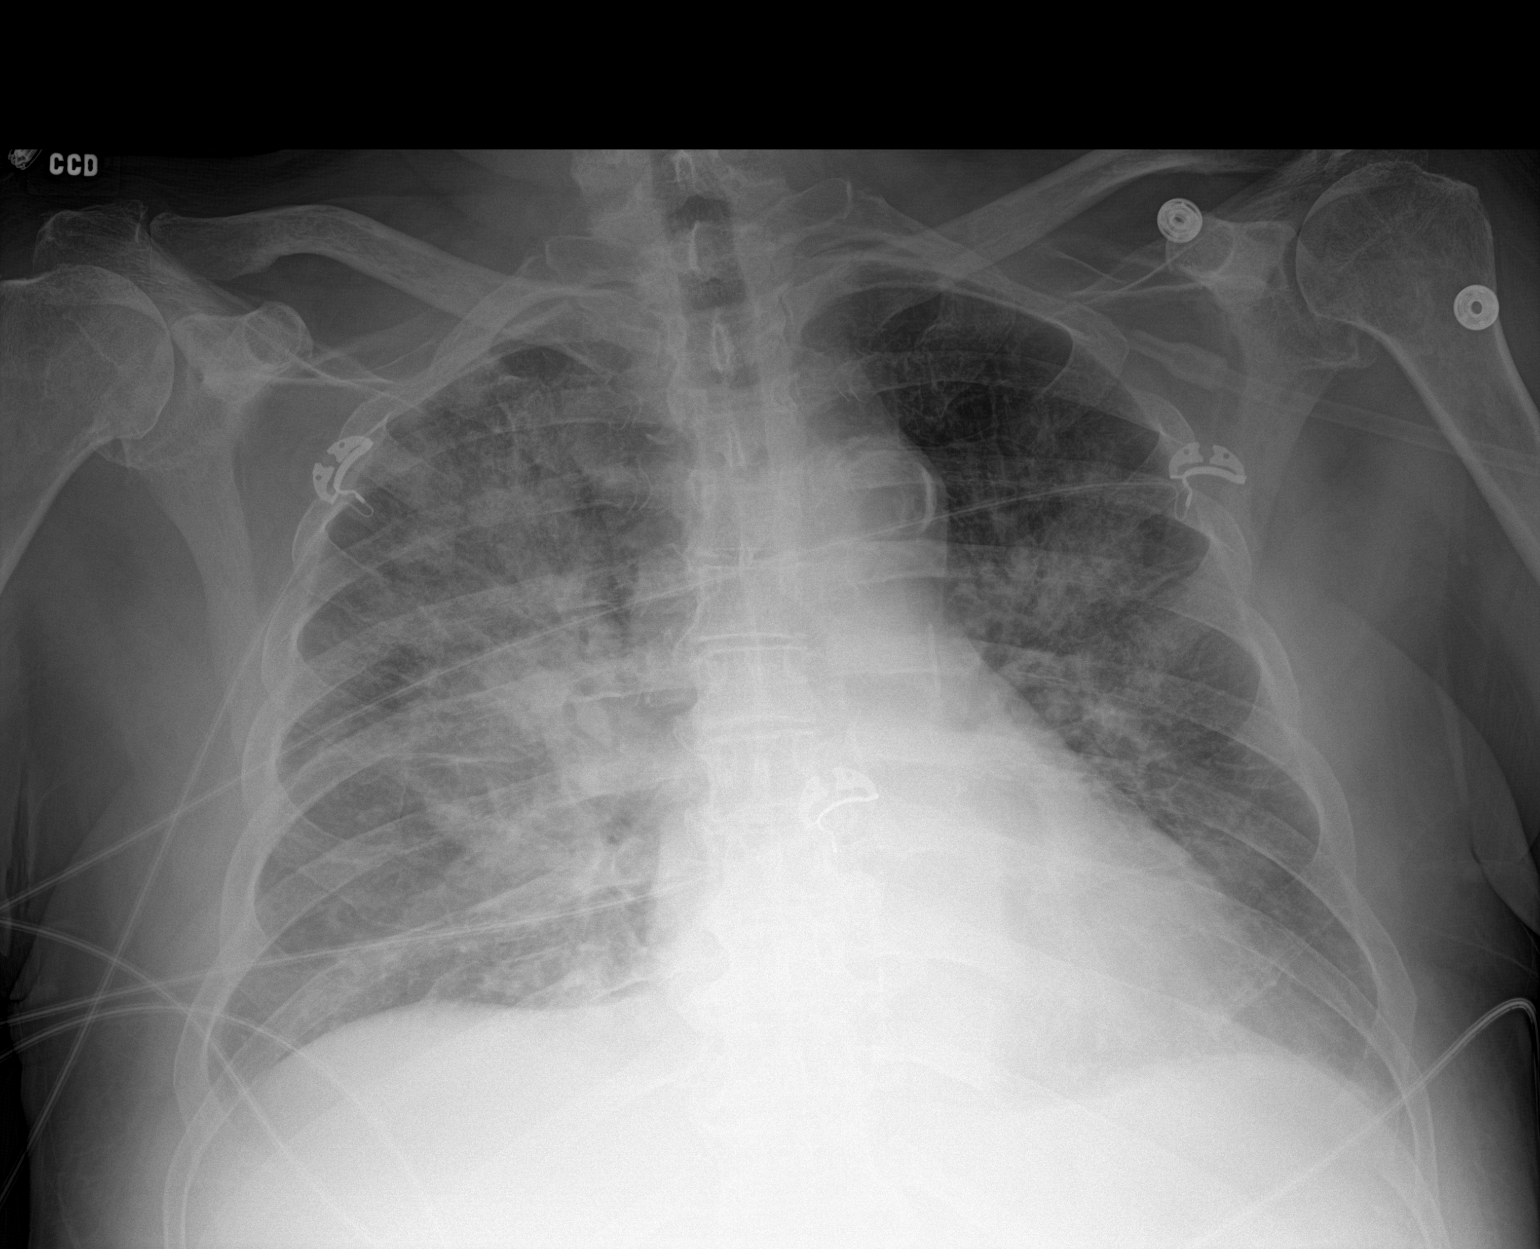

[1 of 1 positions shown; findings below may reference images not displayed]

FINDINGS: Stable cardiomediastinal silhouette. No pneumothorax or pleural
effusion is noted. New large bilateral lung opacities are noted,
right greater than left, consistent with multifocal pneumonia or
possibly edema. Bony thorax is unremarkable.
IMPRESSION: New large bilateral lung opacities are noted, right greater than
left, consistent with multifocal pneumonia or possibly edema.

Aortic Atherosclerosis ([id number]-7Y6.6).

## 2022-01-07 IMAGING — DX DG CHEST 1V PORT
1 series · 1 of 1 positions shown · non-contrast
Comparison: One-view chest x-ray 12/16/2019 [DATE] a.m.

CLINICAL DATA: Urosepsis. Congestive heart failure.

EXAM:
PORTABLE CHEST 1 VIEW

[chest ap]
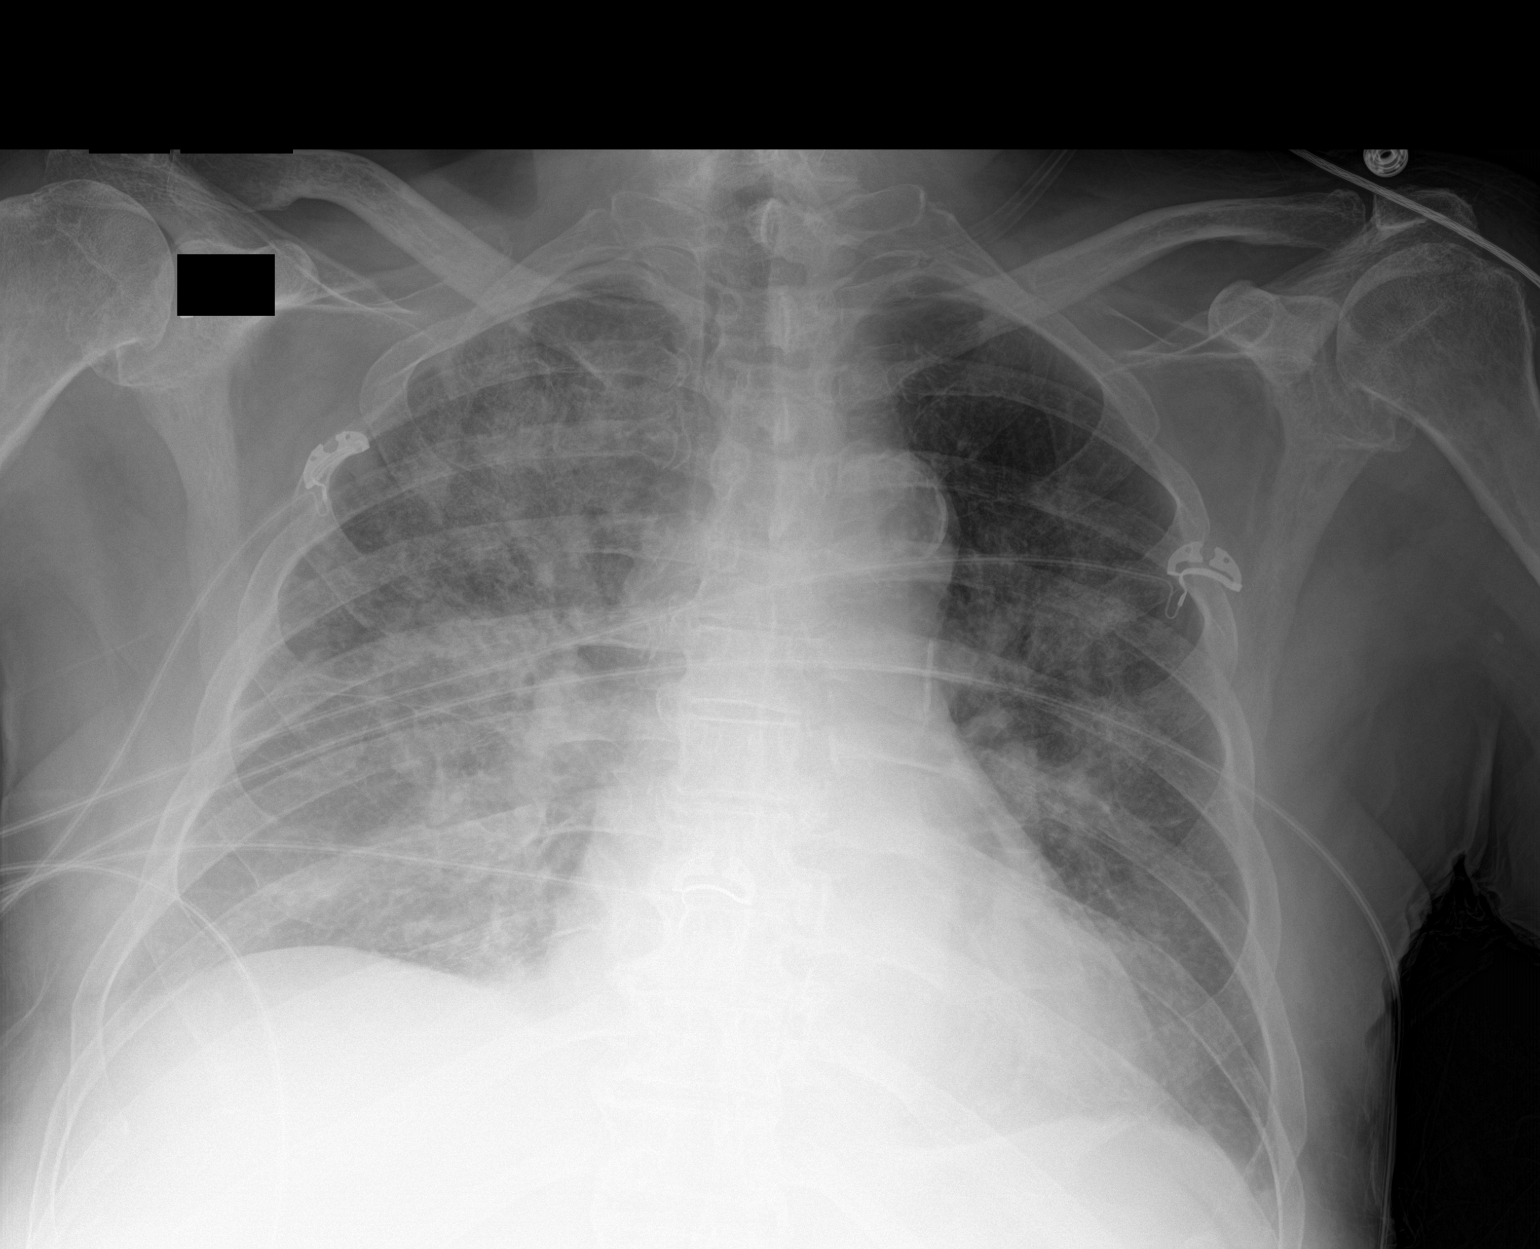

[1 of 1 positions shown; findings below may reference images not displayed]

FINDINGS: Heart size is upper limits of normal. Atherosclerotic calcifications
are present in the aorta. Asymmetric interstitial and airspace
disease is worse on the right. There is no significant interval
change. Effusions are suspected.
IMPRESSION: 1. No significant interval change.
2. Asymmetric interstitial and airspace disease is worse on the
right, compatible with asymmetric edema or infection.
3. Probable small pleural effusions.
